# Patient Record
Sex: Female | Born: 1960 | Race: White | Hispanic: No | Marital: Married | Smoking: Former smoker
Health system: Southern US, Community
[De-identification: ages and names within clinical notes are randomized; demographics above are authoritative.]

## PROBLEM LIST (undated history)

## (undated) DIAGNOSIS — K5792 Diverticulitis of intestine, part unspecified, without perforation or abscess without bleeding: Secondary | ICD-10-CM

## (undated) DIAGNOSIS — F419 Anxiety disorder, unspecified: Secondary | ICD-10-CM

## (undated) DIAGNOSIS — E7800 Pure hypercholesterolemia, unspecified: Secondary | ICD-10-CM

## (undated) HISTORY — PX: COLON SURGERY: SHX602

---

## 1991-06-05 HISTORY — PX: ABDOMINAL HYSTERECTOMY: SHX81

## 2012-02-13 HISTORY — PX: PILONIDAL CYST DRAINAGE: SHX743

## 2014-04-14 ENCOUNTER — Ambulatory Visit: Payer: Self-pay | Admitting: Internal Medicine

## 2014-10-11 ENCOUNTER — Encounter: Payer: Self-pay | Admitting: *Deleted

## 2014-10-13 NOTE — Discharge Instructions (Signed)

## 2014-10-15 ENCOUNTER — Ambulatory Visit
Admission: RE | Admit: 2014-10-15 | Discharge: 2014-10-15 | Disposition: A | Discharge status: Home / Self Care | Payer: PRIVATE HEALTH INSURANCE | Source: Ambulatory Visit | Attending: Gastroenterology | Admitting: Gastroenterology

## 2014-10-15 ENCOUNTER — Ambulatory Visit: Payer: PRIVATE HEALTH INSURANCE | Admitting: Student in an Organized Health Care Education/Training Program

## 2014-10-15 ENCOUNTER — Encounter
Admission: RE | Disposition: A | Discharge status: Home / Self Care | Payer: Self-pay | Source: Ambulatory Visit | Attending: Gastroenterology

## 2014-10-15 ENCOUNTER — Other Ambulatory Visit: Payer: Self-pay | Admitting: Gastroenterology

## 2014-10-15 DIAGNOSIS — D124 Benign neoplasm of descending colon: Secondary | ICD-10-CM | POA: Insufficient documentation

## 2014-10-15 DIAGNOSIS — Z79899 Other long term (current) drug therapy: Secondary | ICD-10-CM | POA: Insufficient documentation

## 2014-10-15 DIAGNOSIS — D122 Benign neoplasm of ascending colon: Secondary | ICD-10-CM | POA: Diagnosis not present

## 2014-10-15 DIAGNOSIS — K640 First degree hemorrhoids: Secondary | ICD-10-CM | POA: Insufficient documentation

## 2014-10-15 DIAGNOSIS — K5792 Diverticulitis of intestine, part unspecified, without perforation or abscess without bleeding: Secondary | ICD-10-CM | POA: Diagnosis present

## 2014-10-15 DIAGNOSIS — K5520 Angiodysplasia of colon without hemorrhage: Secondary | ICD-10-CM | POA: Insufficient documentation

## 2014-10-15 DIAGNOSIS — Z9889 Other specified postprocedural states: Secondary | ICD-10-CM | POA: Insufficient documentation

## 2014-10-15 DIAGNOSIS — Z9049 Acquired absence of other specified parts of digestive tract: Secondary | ICD-10-CM | POA: Diagnosis not present

## 2014-10-15 DIAGNOSIS — K5730 Diverticulosis of large intestine without perforation or abscess without bleeding: Secondary | ICD-10-CM | POA: Diagnosis not present

## 2014-10-15 DIAGNOSIS — Z90710 Acquired absence of both cervix and uterus: Secondary | ICD-10-CM | POA: Insufficient documentation

## 2014-10-15 DIAGNOSIS — F17200 Nicotine dependence, unspecified, uncomplicated: Secondary | ICD-10-CM | POA: Insufficient documentation

## 2014-10-15 HISTORY — PX: COLONOSCOPY: SHX5424

## 2014-10-15 HISTORY — DX: Diverticulitis of intestine, part unspecified, without perforation or abscess without bleeding: K57.92

## 2014-10-15 HISTORY — PX: POLYPECTOMY: SHX149

## 2014-10-15 HISTORY — DX: Pure hypercholesterolemia, unspecified: E78.00

## 2014-10-15 SURGERY — COLONOSCOPY
Anesthesia: Monitor Anesthesia Care | Wound class: Contaminated

## 2014-10-15 MED ORDER — OXYCODONE HCL 5 MG/5ML PO SOLN: 5.0000 mg | Freq: Once | ORAL | Status: DC | PRN

## 2014-10-15 MED ORDER — STERILE WATER FOR IRRIGATION IR SOLN
Status: DC | PRN
  Administered 2014-10-15: 10:00:00

## 2014-10-15 MED ORDER — KETOROLAC TROMETHAMINE 30 MG/ML IJ SOLN: 30.0000 mg | Freq: Once | INTRAMUSCULAR | Status: DC | PRN

## 2014-10-15 MED ORDER — OXYCODONE HCL 5 MG PO TABS
5.0000 mg | ORAL_TABLET | Freq: Once | ORAL | Status: DC | PRN
Start: 2014-10-15 — End: 2014-10-15

## 2014-10-15 MED ORDER — LIDOCAINE HCL (CARDIAC) 20 MG/ML IV SOLN
INTRAVENOUS | Status: DC | PRN
  Administered 2014-10-15: 30 mg via INTRAVENOUS

## 2014-10-15 MED ORDER — PROMETHAZINE HCL 25 MG/ML IJ SOLN: 6.2500 mg | INTRAMUSCULAR | Status: DC | PRN

## 2014-10-15 MED ORDER — LIDOCAINE HCL (CARDIAC) 20 MG/ML IV SOLN: INTRAVENOUS | Status: DC | PRN

## 2014-10-15 MED ORDER — PROPOFOL 10 MG/ML IV BOLUS
INTRAVENOUS | Status: DC | PRN
  Administered 2014-10-15 (×3): 20 mg via INTRAVENOUS
  Administered 2014-10-15: 30 mg via INTRAVENOUS
  Administered 2014-10-15: 20 mg via INTRAVENOUS

## 2014-10-15 MED ORDER — HYDROMORPHONE HCL 1 MG/ML IJ SOLN: 0.2500 mg | INTRAMUSCULAR | Status: DC | PRN

## 2014-10-15 MED ORDER — SODIUM CHLORIDE 0.9 % IV SOLN: INTRAVENOUS | Status: DC

## 2014-10-15 MED ORDER — LACTATED RINGERS IV SOLN
INTRAVENOUS | Status: DC
  Administered 2014-10-15 (×2): via INTRAVENOUS

## 2014-10-15 SURGICAL SUPPLY — 27 items
CANISTER SUCT 1200ML W/VALVE (MISCELLANEOUS) ×3 IMPLANT
FCP ESCP3.2XJMB 240X2.8X (MISCELLANEOUS)
FORCEPS BIOP RAD 4 LRG CAP 4 (CUTTING FORCEPS) ×3 IMPLANT
FORCEPS BIOP RJ4 240 W/NDL (MISCELLANEOUS)
FORCEPS ESCP3.2XJMB 240X2.8X (MISCELLANEOUS) IMPLANT
GOWN CVR UNV OPN BCK APRN NK (MISCELLANEOUS) ×4 IMPLANT
GOWN ISOL THUMB LOOP REG UNIV (MISCELLANEOUS) ×2
HEMOCLIP INSTINCT (CLIP) IMPLANT
INJECTOR VARIJECT VIN23 (MISCELLANEOUS) IMPLANT
KIT CO2 TUBING (TUBING) ×3 IMPLANT
KIT DEFENDO VALVE AND CONN (KITS) IMPLANT
KIT ENDO PROCEDURE OLY (KITS) ×3 IMPLANT
LIGATOR MULTIBAND 6SHOOTER MBL (MISCELLANEOUS) IMPLANT
MARKER SPOT ENDO TATTOO 5ML (MISCELLANEOUS) IMPLANT
PAD GROUND ADULT SPLIT (MISCELLANEOUS) IMPLANT
SNARE SHORT THROW 13M SML OVAL (MISCELLANEOUS) ×3 IMPLANT
SNARE SHORT THROW 30M LRG OVAL (MISCELLANEOUS) IMPLANT
SPOT EX ENDOSCOPIC TATTOO (MISCELLANEOUS)
SUCTION POLY TRAP 4CHAMBER (MISCELLANEOUS) IMPLANT
TRAP SUCTION POLY (MISCELLANEOUS) IMPLANT
TUBING CONN 6MMX3.1M (TUBING)
TUBING SUCTION CONN 0.25 STRL (TUBING) IMPLANT
UNDERPAD 30X60 958B10 (PK) (MISCELLANEOUS) IMPLANT
VALVE BIOPSY ENDO (VALVE) IMPLANT
VARIJECT INJECTOR VIN23 (MISCELLANEOUS)
WATER AUXILLARY (MISCELLANEOUS) IMPLANT
WATER STERILE IRR 500ML POUR (IV SOLUTION) ×3 IMPLANT

## 2014-10-15 NOTE — Op Note (Signed)
[facility] Gastroenterology Patient Name: Jennifer Oconnor Procedure Date: 10/15/2014 9:35 AM MRN: [id number] Account #: [id number] Date of Birth: 11-May-1961 Admit Type: Outpatient Age: 54 Room: [facility] OR ROOM 01 Gender: Female Note Status: Finalized Procedure:         Colonoscopy Indications:       Diverticulitis Providers:         Lucilla Lame, MD Referring MD:      Leona Carry. Hall Busing, MD (Referring MD) Medicines:         Propofol per Anesthesia Complications:     No immediate complications. Procedure:         Pre-Anesthesia Assessment:                    - Prior to the procedure, a History and Physical was                     performed, and patient medications and allergies were                     reviewed. The patient's tolerance of previous anesthesia                     was also reviewed. The risks and benefits of the procedure                     and the sedation options and risks were discussed with the                     patient. All questions were answered, and informed consent                     was obtained. Prior Anticoagulants: The patient has taken                     no previous anticoagulant or antiplatelet agents. ASA                     Grade Assessment: II - A patient with mild systemic                     disease. After reviewing the risks and benefits, the                     patient was deemed in satisfactory condition to undergo                     the procedure.                    After obtaining informed consent, the colonoscope was                     passed under direct vision. Throughout the procedure, the                     patient's blood pressure, pulse, and oxygen saturations                     were monitored continuously. The Olympus CF-HQ190L                     Colonoscope (S#. [id number]) was introduced through the anus  and advanced to the the cecum, identified by appendiceal                     orifice and ileocecal  valve. The colonoscopy was performed                     without difficulty. The patient tolerated the procedure                     well. The quality of the bowel preparation was excellent. Findings:      The perianal and digital rectal examinations were normal.      A 3 mm polyp was found in the ascending colon. The polyp was sessile.       The polyp was removed with a cold biopsy forceps. Resection and       retrieval were complete.      A 6 mm polyp was found in the descending colon. The polyp was sessile.       The polyp was removed with a cold snare. Resection and retrieval were       complete.      Multiple small-mouthed diverticula were found in the sigmoid colon and       in the descending colon.      Non-bleeding internal hemorrhoids were found during retroflexion. The       hemorrhoids were Grade I (internal hemorrhoids that do not prolapse).      A single medium-sized angiodysplastic lesion without bleeding was found       in the ascending colon. Impression:        - One 3 mm polyp in the ascending colon. Resected and                     retrieved.                    - One 6 mm polyp in the descending colon. Resected and                     retrieved.                    - Diverticulosis in the sigmoid colon and in the                     descending colon.                    - Non-bleeding internal hemorrhoids.                    - A single non-bleeding colonic angiodysplastic lesion. Recommendation:    - Await pathology results.                    - Repeat colonoscopy in 5 years if polyp adenoma and 10                     years if hyperplastic Procedure Code(s): --- Professional ---                    [phone], Colonoscopy, flexible; with removal of tumor(s),                     polyp(s), or other lesion(s) by snare technique  [id number], 68, Colonoscopy, flexible; with biopsy, single or                     multiple Diagnosis Code(s): --- Professional ---                     K57.32, Diverticulitis of large intestine without                     perforation or abscess without bleeding                    D12.2, Benign neoplasm of ascending colon                    D12.4, Benign neoplasm of descending colon                    K57.30, Diverticulosis of large intestine without                     perforation or abscess without bleeding CPT copyright 2014 American Medical Association. All rights reserved. The codes documented in this report are preliminary and upon coder review may  be revised to meet current compliance requirements. Lucilla Lame, MD 10/15/2014 10:18:38 AM This report has been signed electronically. Number of Addenda: 0 Note Initiated On: 10/15/2014 9:35 AM Scope Withdrawal Time: 0 hours 10 minutes 18 seconds  Total Procedure Duration: 0 hours 11 minutes 57 seconds       [facility]

## 2014-10-15 NOTE — H&P (Signed)
  [facility]  [address], [place], [place] [address] Phone: [phone] Fax : [phone]  Primary Care Physician:  Albina Billet, MD Primary Gastroenterologist:  Dr. Allen Norris  Pre-Procedure History & Physical: HPI:  Jennifer Oconnor is a 54 y.o. female is here for an colonoscopy.   Past Medical History  Diagnosis Date  . Diverticulitis     in past  . Hypercholesteremia     Past Surgical History  Procedure Laterality Date  . Colon surgery      colectomy 9/11  . Abdominal hysterectomy  1993  . Pilonidal cyst drainage  02/13/12    Prior to Admission medications   Medication Sig Start Date End Date Taking? Authorizing Provider  ALPRAZolam [name]) 0.5 MG tablet Take 0.5 mg by mouth 2 (two) times daily as needed for anxiety.   Yes Historical Provider, MD  buPROPion (WELLBUTRIN XL) 300 MG 24 hr tablet Take 300 mg by mouth daily. AM   Yes Historical Provider, MD  estradiol (VIVELLE-DOT) 0.025 MG/24HR Place 1 patch onto the skin 2 (two) times a week. Wed/Sun   Yes Historical Provider, MD    Allergies as of 10/07/2014  . (Not on File)    History reviewed. No pertinent family history.  History   Social History  . Marital Status: Married    Spouse Name: N/A  . Number of Children: N/A  . Years of Education: N/A   Occupational History  . Not on file.   Social History Main Topics  . Smoking status: Current Every Day Smoker -- 0.50 packs/day for 40 years  . Smokeless tobacco: Not on file  . Alcohol Use: No  . Drug Use: Not on file  . Sexual Activity: Not on file   Other Topics Concern  . Not on file   Social History Narrative  . No narrative on file    Review of Systems: See HPI, otherwise negative ROS  Physical Exam: BP 126/71 mmHg  Pulse 83  Temp(Src) 97.9 F (36.6 C) (Temporal)  Resp 16  Ht 5' (1.524 m)  Wt 148 lb (67.132 kg)  BMI 28.90 kg/m2  SpO2 100% General:   Alert,  pleasant and cooperative in NAD Head:  Normocephalic and  atraumatic. Neck:  Supple; no masses or thyromegaly. Lungs:  Clear throughout to auscultation.    Heart:  Regular rate and rhythm. Abdomen:  Soft, nontender and nondistended. Normal bowel sounds, without guarding, and without rebound.   Neurologic:  Alert and  oriented x4;  grossly normal neurologically.  Impression/Plan: Jennifer Oconnor is here for an colonoscopy to be performed for diverticulitis  Risks, benefits, limitations, and alternatives regarding  colonoscopy have been reviewed with the patient.  Questions have been answered.  All parties agreeable.   [facility], MD  10/15/2014, 9:50 AM

## 2014-10-15 NOTE — Anesthesia Postprocedure Evaluation (Signed)
  Anesthesia Post-op Note  Patient: Jennifer Oconnor  Procedure(s) Performed: Procedure(s) with comments: COLONOSCOPY (N/A) - cecum time- 1003 POLYPECTOMY INTESTINAL  Anesthesia type:MAC  Patient location: PACU  Post pain: Pain level controlled  Post assessment: Post-op Vital signs reviewed, Patient's Cardiovascular Status Stable, Respiratory Function Stable, Patent Airway and No signs of Nausea or vomiting  Post vital signs: Reviewed and stable  Last Vitals:  Filed Vitals:   10/15/14 1045  BP: 125/69  Pulse: 80  Temp:   Resp: 15    Level of consciousness: awake, alert  and patient cooperative  Complications: No apparent anesthesia complications

## 2014-10-15 NOTE — Transfer of Care (Signed)
Immediate Anesthesia Transfer of Care Note  Patient: Jennifer Oconnor  Procedure(s) Performed: Procedure(s) with comments: COLONOSCOPY (N/A) - cecum time- 1003 POLYPECTOMY INTESTINAL  Patient Location: PACU  Anesthesia Type: MAC  Level of Consciousness: awake, alert  and patient cooperative  Airway and Oxygen Therapy: Patient Spontanous Breathing and Patient connected to supplemental oxygen  Post-op Assessment: Post-op Vital signs reviewed, Patient's Cardiovascular Status Stable, Respiratory Function Stable, Patent Airway and No signs of Nausea or vomiting  Post-op Vital Signs: Reviewed and stable  Complications: No apparent anesthesia complications

## 2014-10-15 NOTE — Anesthesia Preprocedure Evaluation (Signed)
Anesthesia Evaluation  Patient identified by MRN, date of birth, ID band Patient awake    Reviewed: Allergy & Precautions, NPO status , Patient's Chart, lab work & pertinent test results  Airway Mallampati: II  TM Distance: >3 FB Neck ROM: Full    Dental no notable dental hx.    Pulmonary neg pulmonary ROS, Current Smoker,  breath sounds clear to auscultation  Pulmonary exam normal       Cardiovascular negative cardio ROS Normal cardiovascular examRhythm:Regular Rate:Normal     Neuro/Psych negative neurological ROS  negative psych ROS   GI/Hepatic negative GI ROS, Neg liver ROS, diverticulitis   Endo/Other  negative endocrine ROS  Renal/GU negative Renal ROS  negative genitourinary   Musculoskeletal negative musculoskeletal ROS (+)   Abdominal   Peds negative pediatric ROS (+)  Hematology negative hematology ROS (+)   Anesthesia Other Findings   Reproductive/Obstetrics negative OB ROS                             Anesthesia Physical Anesthesia Plan  ASA: II  Anesthesia Plan: MAC   Post-op Pain Management:    Induction: Intravenous  Airway Management Planned:   Additional Equipment:   Intra-op Plan:   Post-operative Plan: Extubation in OR  Informed Consent: I have reviewed the patients History and Physical, chart, labs and discussed the procedure including the risks, benefits and alternatives for the proposed anesthesia with the patient or authorized representative who has indicated his/her understanding and acceptance.   Dental advisory given  Plan Discussed with: CRNA  Anesthesia Plan Comments:         Anesthesia Quick Evaluation

## 2014-10-18 ENCOUNTER — Encounter: Payer: Self-pay | Admitting: Gastroenterology

## 2015-03-22 ENCOUNTER — Ambulatory Visit
Admission: RE | Admit: 2015-03-22 | Discharge: 2015-03-22 | Disposition: A | Discharge status: Home / Self Care | Payer: PRIVATE HEALTH INSURANCE | Source: Ambulatory Visit | Attending: Internal Medicine | Admitting: Internal Medicine

## 2015-03-22 ENCOUNTER — Other Ambulatory Visit: Payer: Self-pay | Admitting: Internal Medicine

## 2015-03-22 DIAGNOSIS — R05 Cough: Secondary | ICD-10-CM | POA: Insufficient documentation

## 2015-03-22 DIAGNOSIS — R0600 Dyspnea, unspecified: Secondary | ICD-10-CM | POA: Diagnosis not present

## 2015-03-22 DIAGNOSIS — R059 Cough, unspecified: Secondary | ICD-10-CM

## 2015-03-22 DIAGNOSIS — R509 Fever, unspecified: Secondary | ICD-10-CM | POA: Insufficient documentation

## 2015-10-18 DIAGNOSIS — M7550 Bursitis of unspecified shoulder: Secondary | ICD-10-CM | POA: Insufficient documentation

## 2015-11-20 DIAGNOSIS — F17210 Nicotine dependence, cigarettes, uncomplicated: Secondary | ICD-10-CM | POA: Insufficient documentation

## 2016-10-24 DIAGNOSIS — M5412 Radiculopathy, cervical region: Secondary | ICD-10-CM | POA: Insufficient documentation

## 2016-11-07 ENCOUNTER — Ambulatory Visit (INDEPENDENT_AMBULATORY_CARE_PROVIDER_SITE_OTHER): Payer: BLUE CROSS/BLUE SHIELD | Admitting: Advanced Practice Midwife

## 2016-11-07 ENCOUNTER — Encounter: Payer: Self-pay | Admitting: Advanced Practice Midwife

## 2016-11-07 VITALS — BP 124/78 | Ht 60.0 in | Wt 162.0 lb

## 2016-11-07 DIAGNOSIS — R1030 Lower abdominal pain, unspecified: Secondary | ICD-10-CM | POA: Diagnosis not present

## 2016-11-07 DIAGNOSIS — F419 Anxiety disorder, unspecified: Secondary | ICD-10-CM | POA: Insufficient documentation

## 2016-11-07 DIAGNOSIS — R1032 Left lower quadrant pain: Principal | ICD-10-CM

## 2016-11-07 DIAGNOSIS — R1031 Right lower quadrant pain: Secondary | ICD-10-CM

## 2016-11-07 DIAGNOSIS — F329 Major depressive disorder, single episode, unspecified: Secondary | ICD-10-CM | POA: Insufficient documentation

## 2016-11-07 DIAGNOSIS — F32A Depression, unspecified: Secondary | ICD-10-CM | POA: Insufficient documentation

## 2016-11-07 NOTE — Progress Notes (Signed)
S: Patient is here today with a complaint of bilateral groin pain that is worse on the left side that she first noticed on Monday of this week. She had this pain one other time about a month ago and it went away after a few days. The pain radiates into her thighs and it hurts to stand or walk. She denies injury, trauma. There are no aggravating or alleviating factors that she knows of. She has taken advil one time since Monday with minimal relief. She has a history of "cysts" in her groin and thighs that she has removed by another provider. She currently has a "cyst" in the left groin that she is taking antibiotics for.   O: Vital Signs: BP 124/78   Ht 5' (1.524 m)   Wt 162 lb (73.5 kg)   BMI 31.64 kg/m  Constitutional: Well nourished, well developed female in no acute distress.  HEENT: normal Skin: Warm and dry.  Cardiovascular: Regular rate and rhythm.   Extremity: no evidence of DVT  Respiratory: Clear to auscultation bilateral. Normal respiratory effort Abdomen: soft, nontender, nondistended, no abnormal masses, no epigastric pain and scar from hysterectomy incision has a hard area on left side which could be scar tissue. Mildly tender to palpation Back: no CVAT Neuro: DTRs 2+, Cranial nerves grossly intact Psych: Alert and Oriented x3. No memory deficits. Normal mood and affect.  MS: normal gait, normal bilateral lower extremity ROM/strength/stability.  Pelvic exam:  is not limited by body habitus EGBUS: within normal limits Vagina: within normal limits and with normal mucosa  Cervix: not visualized, no CMT No tenderness to palpation Left groin with hard area mildly tender to palpation- resolving cyst/boil  A: 56 year old with groin pain Possible muscle, tendon or ligament strain Possible scar tissue at abdominal incision site Possible pain from infected cyst/boil in left groin  P: Gyn ultrasound for possible scar tissue on incision and f/u with MD    Rod Can, CNM

## 2016-11-19 ENCOUNTER — Ambulatory Visit (INDEPENDENT_AMBULATORY_CARE_PROVIDER_SITE_OTHER): Payer: BLUE CROSS/BLUE SHIELD

## 2016-11-19 ENCOUNTER — Ambulatory Visit (INDEPENDENT_AMBULATORY_CARE_PROVIDER_SITE_OTHER): Payer: BLUE CROSS/BLUE SHIELD | Admitting: Obstetrics and Gynecology

## 2016-11-19 ENCOUNTER — Encounter: Payer: Self-pay | Admitting: Obstetrics and Gynecology

## 2016-11-19 VITALS — BP 110/72 | HR 80 | Ht 60.0 in | Wt 162.0 lb

## 2016-11-19 DIAGNOSIS — L732 Hidradenitis suppurativa: Secondary | ICD-10-CM

## 2016-11-19 DIAGNOSIS — R1031 Right lower quadrant pain: Secondary | ICD-10-CM

## 2016-11-19 DIAGNOSIS — R1030 Lower abdominal pain, unspecified: Secondary | ICD-10-CM

## 2016-11-19 DIAGNOSIS — R1032 Left lower quadrant pain: Principal | ICD-10-CM

## 2016-11-19 NOTE — Patient Instructions (Signed)
What is hidradenitis suppurativa? - Hidradenitis suppurativa is a condition that causes red, swollen, painful bumps to form on the body, usually in places where the skin rubs together. These bumps can cause so much pain that they make it hard to move. They can smell bad or drain pus or blood. The bumps also tend to linger for weeks or months and keep coming back. People who have hidradenitis suppurativa, also called "HS," often have a hard time dealing with their problem. It can make them feel embarrassed and worried. Sometimes the condition can even cause problems in relationships, or in the workplace. If you have this problem, see a doctor or nurse. There are treatments that can help you. What are the symptoms of HS? - The main symptoms are red, swollen, painful bumps. These bumps can drain pus or blood. The bumps usually form in places where the skin rubs together. Common locations include: ?Armpits ?On or under the breasts (in women) ?In the groin area ?Inner thighs ?Buttocks ?Around or near the anus The skin problems caused by HS last a long time and get worse over time. Often the skin hardens and scars around the painful bumps. Plus, many bumps can form in a single area and sometimes form tunnels under the skin. Should I see a doctor or nurse? - Yes. If you have symptoms of HS, see a doctor or nurse. He or she can look at your skin and find out if HS is the cause of your symptoms. Make sure you tell the doctor or nurse if you feel sad or embarrassed because of your symptoms. The doctor or nurse can help you deal with these problems. It's also important to see your doctor regularly if you have HS. People with HS have a higher chance of getting other health problems, too, such as diabetes and heart disease. Your doctor can check for these problems and suggest treatment if needed. How is HS treated? - Treatment can include: ?Antibiotic liquids or gels that you put on the affected areas ?Antibiotic  pills, which you might need to take for a long time ?Injections of steroid medicines into affected areas to bring down inflammation Women with HS sometimes also take hormone treatments to improve their condition. This usually involves taking a birth control pill every day. Sometimes women take other types of hormone medicines, too. People with severe, long-lasting problems can have surgery that helps HS to heal. They can also get special medicines that can reduce inflammation in the skin. Is there anything I can do on my own to feel better? - Yes. First, you should know that you did not do anything to cause your condition. It is not your fault. You did not cause it by being unclean. You should also know that you cannot spread your condition to anyone else. It is not "contagious." Here are some things you can do to reduce your symptoms: ?Do not wear tight-fitting clothes ?Try to avoid activities that cause your skin to rub against itself a lot ?Shower every day and wash areas of HS gently with your fingers. Do not scrub affected areas with a washcloth, loofah, or brush. ?Stop smoking, if you smoke. People who smoke are more likely to have HS ?Lose weight, if you are overweight. HS is more common and more severe in people who are overweight.

## 2016-11-19 NOTE — Progress Notes (Signed)
Gynecology Ultrasound Follow Up  Chief Complaint:  Chief Complaint  Patient presents with  . Follow-up    GYN Ultrasound follow up     History of Present Illness: Patient is a 56 y.o. female who presents today for ultrasound evaluation of LLQ pain.  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  Has had one since onset of symptoms, did drain, and symptoms have since improved.  Review of Systems: Review of Systems  Constitutional: Negative for chills, fever and weight loss.  Gastrointestinal: Positive for abdominal pain. Negative for constipation and diarrhea.    Past Medical History:  Past Medical History:  Diagnosis Date  . Diverticulitis    in past  . Hypercholesteremia     Past Surgical History:  Past Surgical History:  Procedure Laterality Date  . ABDOMINAL HYSTERECTOMY  1993  . COLON SURGERY     colectomy 9/11  . COLONOSCOPY N/A 10/15/2014   Procedure: COLONOSCOPY;  Surgeon: Lucilla Lame, MD;  Location: [facility];  Service: Gastroenterology;  Laterality: N/A;  cecum time- 1003  . PILONIDAL CYST DRAINAGE  02/13/12  . POLYPECTOMY  10/15/2014   Procedure: POLYPECTOMY INTESTINAL;  Surgeon: Lucilla Lame, MD;  Location: [facility];  Service: Gastroenterology;;    Gynecologic History:  No LMP recorded. Patient has had a hysterectomy.  Family History:  History reviewed. No pertinent family history.  Social History:  Social History   Social History  . Marital status: Married    Spouse name: N/A  . Number of children: N/A  . Years of education: N/A   Occupational History  . Not on file.   Social History Main Topics  . Smoking status: Current Every Day Smoker    Packs/day: 0.50    Years: 40.00  . Smokeless tobacco: Never Used  .  Alcohol use No  . Drug use: No  . Sexual activity: Yes    Birth control/ protection: Surgical   Other Topics Concern  . Not on file   Social History Narrative  . No narrative on file    Allergies:  No Known Allergies  Medications: Prior to Admission medications   Medication Sig Start Date End Date Taking? Authorizing Provider  ALPRAZolam [name]) 0.5 MG tablet Take 0.5 mg by mouth 2 (two) times daily as needed for anxiety.   Yes [provider]  buPROPion (WELLBUTRIN XL) 300 MG 24 hr tablet Take 300 mg by mouth daily. AM   Yes [provider]  cephALEXin (KEFLEX) 500 MG capsule cephalexin 500 mg capsule  TAKE ONE CAPSULE BY MOUTH 3 TIMES A DAY FOR 10 DAYS   Yes [provider]  doxycycline (VIBRAMYCIN) 100 MG capsule doxycycline hyclate 100 mg capsule  TK 1 C PO BID WITH FOOD AND DRINK   Yes [provider]  estradiol (VIVELLE-DOT) 0.025 MG/24HR Place 1 patch onto the skin 2 (two) times a week. Wed/Sun   Yes [provider]  metoprolol succinate (TOPROL-XL) 25 MG 24 hr tablet Take 25 mg by mouth daily.   Yes [provider]    Physical Exam Vitals: Blood pressure 110/72, pulse 80, height 5' (1.524 m), weight 162 lb (73.5 kg).  General: NAD HEENT: normocephalic, anicteric Pulmonary: No increased work of breathing Extremities: no edema, erythema, or tenderness External female genital: Grossly normal.  There are changes consistent  with prior I&D's groin excisions.   Small sinus tract appreciate in the right groin.  No active drainage. Neurologic: Grossly intact, normal gait Psychiatric: mood appropriate, affect full   Assessment: 56 y.o. G2P1011 No problem-specific Assessment & Plan notes found for this encounter.   Plan: Problem List Items Addressed This Visit    None    Visit Diagnoses    Hydradenitis    -  Primary      1) Changes consistent with hydradenitis - is established with dermatology 2) Normal gyn  imaging - I do not see any evidence of gyn pathology to attribute to the patient's symptoms and given active signs of hydradenitis matching laterality of symptoms this is the most likely explanation.   3) A total of 15 minutes were spent in face-to-face contact with the patient during this encounter with over half of that time devoted to counseling and coordination of care.           See [facility] dermatology [place] normal

## 2017-05-28 ENCOUNTER — Emergency Department: Payer: PRIVATE HEALTH INSURANCE

## 2017-05-28 ENCOUNTER — Encounter: Payer: Self-pay | Admitting: Emergency Medicine

## 2017-05-28 ENCOUNTER — Emergency Department
Admission: EM | Admit: 2017-05-28 | Discharge: 2017-05-28 | Disposition: A | Discharge status: Home / Self Care | Payer: PRIVATE HEALTH INSURANCE | Attending: Emergency Medicine | Admitting: Emergency Medicine

## 2017-05-28 ENCOUNTER — Other Ambulatory Visit: Payer: Self-pay

## 2017-05-28 DIAGNOSIS — Y999 Unspecified external cause status: Secondary | ICD-10-CM | POA: Insufficient documentation

## 2017-05-28 DIAGNOSIS — S060X0A Concussion without loss of consciousness, initial encounter: Secondary | ICD-10-CM

## 2017-05-28 DIAGNOSIS — Z23 Encounter for immunization: Secondary | ICD-10-CM | POA: Insufficient documentation

## 2017-05-28 DIAGNOSIS — W000XXA Fall on same level due to ice and snow, initial encounter: Secondary | ICD-10-CM | POA: Insufficient documentation

## 2017-05-28 DIAGNOSIS — F17200 Nicotine dependence, unspecified, uncomplicated: Secondary | ICD-10-CM | POA: Insufficient documentation

## 2017-05-28 DIAGNOSIS — Y9301 Activity, walking, marching and hiking: Secondary | ICD-10-CM | POA: Insufficient documentation

## 2017-05-28 DIAGNOSIS — S01112A Laceration without foreign body of left eyelid and periocular area, initial encounter: Secondary | ICD-10-CM

## 2017-05-28 DIAGNOSIS — Z79899 Other long term (current) drug therapy: Secondary | ICD-10-CM | POA: Insufficient documentation

## 2017-05-28 DIAGNOSIS — Y929 Unspecified place or not applicable: Secondary | ICD-10-CM | POA: Insufficient documentation

## 2017-05-28 DIAGNOSIS — S61412A Laceration without foreign body of left hand, initial encounter: Secondary | ICD-10-CM | POA: Insufficient documentation

## 2017-05-28 MED ORDER — LIDOCAINE-EPINEPHRINE (PF) 2 %-1:200000 IJ SOLN
10.0000 mL | Freq: Once | INTRAMUSCULAR | Status: AC
  Administered 2017-05-28: 10 mL via INTRADERMAL
  Filled 2017-05-28: qty 10

## 2017-05-28 MED ORDER — TETANUS-DIPHTH-ACELL PERTUSSIS 5-2.5-18.5 LF-MCG/0.5 IM SUSP
0.5000 mL | Freq: Once | INTRAMUSCULAR | Status: AC
  Administered 2017-05-28: 0.5 mL via INTRAMUSCULAR
  Filled 2017-05-28: qty 0.5

## 2017-05-28 NOTE — ED Notes (Signed)
Pt ambulating without assistance

## 2017-05-28 NOTE — ED Triage Notes (Signed)
Pt slipped and fell hitting left forehead and left hand. Per husband pt was confused s/p injury. Pt a/o now. Denies LOC. Denies blood thinners. Also concerned that she has wood in hand.

## 2017-05-28 NOTE — ED Notes (Signed)
ED Provider at bedside. 

## 2017-05-28 NOTE — ED Provider Notes (Signed)
[facility] Emergency Department Provider Note  ____________________________________________   First MD Initiated Contact with Patient 05/28/17 [phone]     (approximate)  I have reviewed the triage vital signs and the nursing notes.   HISTORY  Chief Complaint Fall    HPI Jennifer Oconnor is a 56 y.o. female with a history of diverticulitis was presented to the emergency department today after a fall.  She says that she was walking up a plywood ramp that had ice on it when she slipped and fell forward.  She denies any loss of consciousness but her husband says that she was quite confused after the fall.  Says that it hurts over the left side of her eyebrow on the left where she sustained a laceration but otherwise does not of any nausea, dizziness.  Says that she "basically feels like myself."  Denies taking any blood thinners.  She also has a laceration over the webspace between the index finger and the thumb on the left where she pulled out a wood chip.  She is concerned that there still may be a wood in there but does not have any foreign body sensation.  No weakness, numbness or pain to the left hand.  Patient's last tetanus shot was in 2010.  Also sustained an abrasion over the left anterior knee but is able to ambulate without issue.  Past Medical History:  Diagnosis Date  . Diverticulitis    in past  . Hypercholesteremia     Patient Active Problem List   Diagnosis Date Noted  . Anxiety 11/07/2016  . Depression 11/07/2016  . Cervical radiculopathy 10/24/2016  . Cigarette smoker 11/20/2015  . Bursitis of shoulder 10/18/2015    Past Surgical History:  Procedure Laterality Date  . ABDOMINAL HYSTERECTOMY  1993  . COLON SURGERY     colectomy 9/11  . COLONOSCOPY N/A 10/15/2014   Procedure: COLONOSCOPY;  Surgeon: Lucilla Lame, MD;  Location: [place];  Service: Gastroenterology;  Laterality: N/A;  cecum time- 1003  . PILONIDAL CYST DRAINAGE   02/13/12  . POLYPECTOMY  10/15/2014   Procedure: POLYPECTOMY INTESTINAL;  Surgeon: Lucilla Lame, MD;  Location: [place];  Service: Gastroenterology;;    Prior to Admission medications   Medication Sig Start Date End Date Taking? Authorizing Provider  ALPRAZolam [name]) 0.5 MG tablet Take 0.5 mg by mouth 2 (two) times daily as needed for anxiety.    [provider]  buPROPion (WELLBUTRIN XL) 300 MG 24 hr tablet Take 300 mg by mouth daily. AM    [provider]  cephALEXin (KEFLEX) 500 MG capsule cephalexin 500 mg capsule  TAKE ONE CAPSULE BY MOUTH 3 TIMES A DAY FOR 10 DAYS    [provider]  doxycycline (VIBRAMYCIN) 100 MG capsule doxycycline hyclate 100 mg capsule  TK 1 C PO BID WITH FOOD AND DRINK    [provider]  estradiol (VIVELLE-DOT) 0.025 MG/24HR Place 1 patch onto the skin 2 (two) times a week. Wed/Sun    [provider]  metoprolol succinate (TOPROL-XL) 25 MG 24 hr tablet Take 25 mg by mouth daily.    [provider]    Allergies Patient has no known allergies.  History reviewed. No pertinent family history.  Social History Social History   Tobacco Use  . Smoking status: Current Every Day Smoker    Packs/day: 0.50    Years: 40.00    Pack years: 20.00  . Smokeless tobacco: Never Used  Substance  Use Topics  . Alcohol use: No  . Drug use: No    Review of Systems  Constitutional: No fever/chills Eyes: No visual changes. ENT: No sore throat. Cardiovascular: Denies chest pain. Respiratory: Denies shortness of breath. Gastrointestinal: No abdominal pain.  No nausea, no vomiting.  No diarrhea.  No constipation. Genitourinary: Negative for dysuria. Musculoskeletal: Negative for back pain. Skin: Negative for rash. Neurological: Negative for focal weakness or numbness.   ____________________________________________   PHYSICAL EXAM:  VITAL SIGNS: ED Triage Vitals  Enc Vitals Group     BP 05/28/17  0817 (!) 178/82     Pulse Rate 05/28/17 0817 100     Resp 05/28/17 0817 18     Temp 05/28/17 0817 98.4 F (36.9 C)     Temp Source 05/28/17 0817 Oral     SpO2 05/28/17 0817 97 %     Weight 05/28/17 0817 130 lb (59 kg)     Height 05/28/17 0817 5\' 5"  (1.651 m)     Head Circumference --      Peak Flow --      Pain Score 05/28/17 0818 7     Pain Loc --      Pain Edu? --      Excl. in [place]? --     Constitutional: Alert and oriented. Well appearing and in no acute distress. Eyes: Conjunctivae are normal.  Head: 3 cm laceration overlying the left lateral left eyebrow.  The laceration is mostly linear but as that goes more laterally it splits into 2 limb for about 1/2 cm segment each.  No depression over the laceration.  No active bleeding.  No pus. Nose: No congestion/rhinnorhea. Mouth/Throat: Mucous membranes are moist.  Neck: No stridor.  No tenderness to palpation to the midline cervical spine. Cardiovascular: Normal rate, regular rhythm. Grossly normal heart sounds.  Good peripheral circulation. Respiratory: Normal respiratory effort.  No retractions. Lungs CTAB. Gastrointestinal: Soft and nontender. No distention. No CVA tenderness. Musculoskeletal: No lower extremity tenderness nor edema.  No joint effusions. Neurologic:  Normal speech and language. No gross focal neurologic deficits are appreciated. Skin: Left hand with a 3 cm, V-shaped laceration to the webspace between the left index anger and thumb no foreign body palpated nor visualized.  Patient without any bony tenderness to the hand and full range of motion of the fingers and the wrist.  Brisk capillary refill.  Sensation is intact light touch.  No active bleeding.  1 cm, circular, superficial abrasion over the left anterior knee without any tenderness to the knee.  Full active range of motion.  Psychiatric: Mood and affect are normal. Speech and behavior are normal.  ____________________________________________   LABS (all  labs ordered are listed, but only abnormal results are displayed)  Labs Reviewed - No data to display ____________________________________________  EKG   ____________________________________________  RADIOLOGY  No acute findings on imaging.  No fb visualized.  ____________________________________________   PROCEDURES  Procedure(s) performed:    Marland KitchenMarland KitchenLaceration Repair Date/Time: 05/28/2017 10:17 AM Performed by: Orbie Pyo, MD Authorized by: Orbie Pyo, MD   Consent:    Consent obtained:  Verbal   Consent given by:  Patient   Risks discussed:  Infection, pain, retained foreign body, poor cosmetic result and poor wound healing   Alternatives discussed:  No treatment Anesthesia (see MAR for exact dosages):    Anesthesia method:  Local infiltration   Local anesthetic:  Lidocaine 2% WITH epi Laceration details:    Location:  Face  Face location:  L eyebrow   Length (cm):  4   Depth (mm):  4 Repair type:    Repair type:  Simple Pre-procedure details:    Preparation:  Patient was prepped and draped in usual sterile fashion and imaging obtained to evaluate for foreign bodies Exploration:    Hemostasis achieved with:  Direct pressure   Wound exploration: entire depth of wound probed and visualized     Contaminated: no   Treatment:    Area cleansed with:  Saline   Amount of cleaning:  Extensive   Irrigation solution:  Sterile saline   Irrigation method:  Pressure wash   Visualized foreign bodies/material removed: no   Skin repair:    Repair method:  Sutures   Suture size:  6-0   Suture material:  Nylon   Suture technique:  Simple interrupted   Number of sutures:  2 Approximation:    Approximation:  Close Post-procedure details:    Patient tolerance of procedure:  Tolerated well, no immediate complications Comments:     Used 2 sutures laterally and then dermabond medially.  Good cosmesis achieved .Marland KitchenLaceration Repair Date/Time:  05/28/2017 10:26 AM Performed by: Orbie Pyo, MD Authorized by: Orbie Pyo, MD   Consent:    Consent obtained:  Verbal   Consent given by:  Patient   Risks discussed:  Infection, pain, retained foreign body, poor cosmetic result and poor wound healing   Alternatives discussed:  No treatment Anesthesia (see MAR for exact dosages):    Anesthesia method:  Local infiltration   Local anesthetic:  Lidocaine 2% WITH epi Laceration details:    Location:  Hand   Hand location:  L palm   Length (cm):  3   Depth (mm):  3 Repair type:    Repair type:  Simple Pre-procedure details:    Preparation:  Patient was prepped and draped in usual sterile fashion Exploration:    Hemostasis achieved with:  Direct pressure   Wound exploration: entire depth of wound probed and visualized     Contaminated: no   Treatment:    Area cleansed with:  Saline   Amount of cleaning:  Extensive   Irrigation solution:  Sterile saline   Irrigation method:  Pressure wash   Visualized foreign bodies/material removed: no   Skin repair:    Repair method:  Sutures   Suture size:  3-0   Suture material:  Nylon   Number of sutures:  3 Approximation:    Approximation:  Close   V[place] border: well-aligned   Post-procedure details:    Patient tolerance of procedure:  Tolerated well, no immediate complications    Critical Care performed:   ____________________________________________   INITIAL IMPRESSION / ASSESSMENT AND PLAN / ED COURSE  Pertinent labs & imaging results that were available during my care of the patient were reviewed by me and considered in my medical decision making (see chart for details).  DDX: Concussion, facial laceration, skull fracture, intra-cranial hemorrhage, hand laceration, foreign body to the hand.    ----------------------------------------- 10:27 AM on 05/28/2017 -----------------------------------------  Imaging reassuring.  Wounds closed and  patient instructed to keep the wounds dry for 24 hours and then may shower normally but not bathe or submerge the wounds.  Patient knows to have the facial sutures taken out in 5 days and the hand sutures taken out in 10.  Patient still with headache but right over the site of the eyebrow laceration.  However, due to patient being confused earlier  I will give her concussion discharge instructions.  She is aware to practice brain rest.  She is understanding of the diagnosis as well as the treatment plan willing to comply.  ____________________________________________   FINAL CLINICAL IMPRESSION(S) / ED DIAGNOSES  Fall.  Concussion.  Eyebrow laceration.  Hand laceration.    NEW MEDICATIONS STARTED DURING THIS VISIT:  This SmartLink is deprecated. Use AVSMEDLIST instead to display the medication list for a patient.   Note:  This document was prepared using Dragon voice recognition software and may include unintentional dictation errors.     Orbie Pyo, MD 05/28/17 1028

## 2017-05-28 NOTE — ED Notes (Signed)
Wounds cleaned and dressed with dry sterile dressing. Pt ambulated out of department

## 2017-06-04 DIAGNOSIS — I69328 Other speech and language deficits following cerebral infarction: Secondary | ICD-10-CM

## 2017-06-04 DIAGNOSIS — I639 Cerebral infarction, unspecified: Secondary | ICD-10-CM

## 2017-06-04 HISTORY — DX: Cerebral infarction, unspecified: I63.9

## 2017-06-04 HISTORY — DX: Other speech and language deficits following cerebral infarction: I69.328

## 2017-06-11 ENCOUNTER — Inpatient Hospital Stay
Admission: EM | Admit: 2017-06-11 | Discharge: 2017-06-16 | DRG: 038 | Disposition: A | Discharge status: Home / Self Care | Payer: PRIVATE HEALTH INSURANCE | Attending: Internal Medicine | Admitting: Internal Medicine

## 2017-06-11 ENCOUNTER — Emergency Department: Payer: Self-pay

## 2017-06-11 ENCOUNTER — Inpatient Hospital Stay: Payer: Self-pay

## 2017-06-11 ENCOUNTER — Encounter: Payer: Self-pay | Admitting: Emergency Medicine

## 2017-06-11 DIAGNOSIS — E7800 Pure hypercholesterolemia, unspecified: Secondary | ICD-10-CM | POA: Diagnosis present

## 2017-06-11 DIAGNOSIS — I639 Cerebral infarction, unspecified: Principal | ICD-10-CM | POA: Diagnosis present

## 2017-06-11 DIAGNOSIS — Z9114 Patient's other noncompliance with medication regimen: Secondary | ICD-10-CM

## 2017-06-11 DIAGNOSIS — I10 Essential (primary) hypertension: Secondary | ICD-10-CM | POA: Diagnosis present

## 2017-06-11 DIAGNOSIS — I739 Peripheral vascular disease, unspecified: Secondary | ICD-10-CM | POA: Diagnosis present

## 2017-06-11 DIAGNOSIS — Z90710 Acquired absence of both cervix and uterus: Secondary | ICD-10-CM

## 2017-06-11 DIAGNOSIS — F419 Anxiety disorder, unspecified: Secondary | ICD-10-CM | POA: Diagnosis present

## 2017-06-11 DIAGNOSIS — F17200 Nicotine dependence, unspecified, uncomplicated: Secondary | ICD-10-CM | POA: Diagnosis present

## 2017-06-11 DIAGNOSIS — Z823 Family history of stroke: Secondary | ICD-10-CM

## 2017-06-11 DIAGNOSIS — G8191 Hemiplegia, unspecified affecting right dominant side: Secondary | ICD-10-CM | POA: Diagnosis present

## 2017-06-11 DIAGNOSIS — E785 Hyperlipidemia, unspecified: Secondary | ICD-10-CM | POA: Diagnosis present

## 2017-06-11 DIAGNOSIS — R29701 NIHSS score 1: Secondary | ICD-10-CM | POA: Diagnosis present

## 2017-06-11 HISTORY — DX: Anxiety disorder, unspecified: F41.9

## 2017-06-11 LAB — LIPID PANEL
CHOL/HDL RATIO: 7.1 ratio
CHOLESTEROL: 247 mg/dL — AB (ref 0–200)
HDL: 35 mg/dL — AB (ref >40)
LDL Cholesterol: UNDETERMINED mg/dL (ref 0–99)
Triglycerides: 512 mg/dL — ABNORMAL HIGH (ref <150)
VLDL: UNDETERMINED mg/dL (ref 0–40)

## 2017-06-11 LAB — COMPREHENSIVE METABOLIC PANEL
ALT: 18 U/L (ref 14–54)
AST: 31 U/L (ref 15–41)
Albumin: 4.5 g/dL (ref 3.5–5.0)
Alkaline Phosphatase: 90 U/L (ref 38–126)
Anion gap: 9 (ref 5–15)
BILIRUBIN TOTAL: 1.1 mg/dL (ref 0.3–1.2)
BUN: 16 mg/dL (ref 6–20)
CHLORIDE: 105 mmol/L (ref 101–111)
CO2: 23 mmol/L (ref 22–32)
CREATININE: 1.24 mg/dL — AB (ref 0.44–1.00)
Calcium: 9.7 mg/dL (ref 8.9–10.3)
GFR, EST AFRICAN AMERICAN: 55 mL/min — AB (ref >60)
GFR, EST NON AFRICAN AMERICAN: 47 mL/min — AB (ref >60)
Glucose, Bld: 119 mg/dL — ABNORMAL HIGH (ref 65–99)
POTASSIUM: 4.8 mmol/L (ref 3.5–5.1)
Sodium: 137 mmol/L (ref 135–145)
Total Protein: 7.8 g/dL (ref 6.5–8.1)

## 2017-06-11 LAB — CBC WITH DIFFERENTIAL/PLATELET
BASOS ABS: 0.1 10*3/uL (ref 0–0.1)
Basophils Relative: 1 %
EOS ABS: 0.1 10*3/uL (ref 0–0.7)
EOS PCT: 1 %
HCT: 43.3 % (ref 35.0–47.0)
Hemoglobin: 14.7 g/dL (ref 12.0–16.0)
LYMPHS ABS: 3.3 10*3/uL (ref 1.0–3.6)
LYMPHS PCT: 35 %
MCH: 32.1 pg (ref 26.0–34.0)
MCHC: 33.9 g/dL (ref 32.0–36.0)
MCV: 94.8 fL (ref 80.0–100.0)
Monocytes Absolute: 0.6 10*3/uL (ref 0.2–0.9)
Monocytes Relative: 6 %
NEUTROS PCT: 57 %
Neutro Abs: 5.4 10*3/uL (ref 1.4–6.5)
PLATELETS: 300 10*3/uL (ref 150–440)
RBC: 4.57 MIL/uL (ref 3.80–5.20)
RDW: 13.7 % (ref 11.5–14.5)
WBC: 9.4 10*3/uL (ref 3.6–11.0)

## 2017-06-11 MED ORDER — ASPIRIN 81 MG PO CHEW
324.0000 mg | CHEWABLE_TABLET | Freq: Once | ORAL | Status: AC
  Administered 2017-06-11: 324 mg via ORAL
  Filled 2017-06-11: qty 4

## 2017-06-11 MED ORDER — ACETAMINOPHEN 650 MG RE SUPP
650.0000 mg | RECTAL | Status: DC | PRN
Start: 2017-06-11 — End: 2017-06-14

## 2017-06-11 MED ORDER — ACETAMINOPHEN 325 MG PO TABS: 650.0000 mg | ORAL_TABLET | ORAL | Status: DC | PRN

## 2017-06-11 MED ORDER — ENOXAPARIN SODIUM 40 MG/0.4ML ~~LOC~~ SOLN
40.0000 mg | SUBCUTANEOUS | Status: DC
  Administered 2017-06-11 – 2017-06-15 (×5): 40 mg via SUBCUTANEOUS
  Filled 2017-06-11 (×5): qty 0.4

## 2017-06-11 MED ORDER — ALPRAZOLAM 0.5 MG PO TABS
0.5000 mg | ORAL_TABLET | Freq: Two times a day (BID) | ORAL | Status: DC | PRN
  Administered 2017-06-11 – 2017-06-15 (×4): 0.5 mg via ORAL
  Filled 2017-06-11 (×4): qty 1

## 2017-06-11 MED ORDER — ASPIRIN 325 MG PO TABS
325.0000 mg | ORAL_TABLET | Freq: Every day | ORAL | Status: DC
  Administered 2017-06-12: 11:00:00 325 mg via ORAL
  Filled 2017-06-11: qty 1

## 2017-06-11 MED ORDER — HYDRALAZINE HCL 20 MG/ML IJ SOLN: 10.0000 mg | Freq: Four times a day (QID) | INTRAMUSCULAR | Status: DC | PRN

## 2017-06-11 MED ORDER — SODIUM CHLORIDE 0.9 % IV SOLN
Freq: Once | INTRAVENOUS | Status: AC
  Administered 2017-06-11: 21:00:00 via INTRAVENOUS

## 2017-06-11 MED ORDER — STROKE: EARLY STAGES OF RECOVERY BOOK
Freq: Once | Status: AC
  Administered 2017-06-11: 21:00:00

## 2017-06-11 MED ORDER — BUPROPION HCL ER (XL) 150 MG PO TB24
300.0000 mg | ORAL_TABLET | Freq: Every day | ORAL | Status: DC
Start: 2017-06-12 — End: 2017-06-16
  Administered 2017-06-12 – 2017-06-16 (×4): 300 mg via ORAL
  Filled 2017-06-11 (×3): qty 1
  Filled 2017-06-11: qty 2
  Filled 2017-06-11: qty 1

## 2017-06-11 MED ORDER — ACETAMINOPHEN 160 MG/5ML PO SOLN
650.0000 mg | ORAL | Status: DC | PRN
  Filled 2017-06-11: qty 20.3

## 2017-06-11 MED ORDER — ASPIRIN 300 MG RE SUPP
300.0000 mg | Freq: Every day | RECTAL | Status: DC
  Filled 2017-06-11: qty 1

## 2017-06-11 NOTE — H&P (Addendum)
[facility] [place] [facility] [place] NAME: Jennifer Oconnor    MR#:  [id number]  DATE OF BIRTH:  09/04/1960  DATE OF ADMISSION:  06/11/2017  PRIMARY CARE PHYSICIAN: Albina Billet, MD   REQUESTING/REFERRING PHYSICIAN: Dr. Rip Harbour  CHIEF COMPLAINT:   My thought process was not proper HISTORY OF PRESENT ILLNESS:  Jennifer Oconnor  is a 57 y.o. female with a known history of anxiety and hyperlipidemia comes to the emergency room after she started noticing her thought process was not proper.  She was taking a long time to process of part since morning.  She also had some numbness in her right upper extremity.  Husband came home from work and noticed similar findings and brought to the emergency room where CT of the head shows possible evolving left frontal stroke in the MCA territory Patient denies any dysphagia numbness tingling focal weakness. She received aspirin.  She is being admitted for further evaluation and management  PAST MEDICAL HISTORY:   Past Medical History:  Diagnosis Date  . Diverticulitis    in past  . Hypercholesteremia     PAST SURGICAL HISTOIRY:   Past Surgical History:  Procedure Laterality Date  . ABDOMINAL HYSTERECTOMY  1993  . COLON SURGERY     colectomy 9/11  . COLONOSCOPY N/A 10/15/2014   Procedure: COLONOSCOPY;  Surgeon: Lucilla Lame, MD;  Location: [place];  Service: Gastroenterology;  Laterality: N/A;  cecum time- 1003  . PILONIDAL CYST DRAINAGE  02/13/12  . POLYPECTOMY  10/15/2014   Procedure: POLYPECTOMY INTESTINAL;  Surgeon: Lucilla Lame, MD;  Location: [place];  Service: Gastroenterology;;    SOCIAL HISTORY:   Social History   Tobacco Use  . Smoking status: Current Every Day Smoker    Packs/day: 0.50    Years: 40.00    Pack years: 20.00  . Smokeless tobacco: Never Used  Substance Use Topics  . Alcohol use: No    FAMILY HISTORY:  No family history on file.  DRUG ALLERGIES:  No Known  Allergies  REVIEW OF SYSTEMS:  Review of Systems  Constitutional: Negative for chills, fever and weight loss.  HENT: Negative for ear discharge, ear pain and nosebleeds.   Eyes: Negative for blurred vision, pain and discharge.  Respiratory: Negative for sputum production, shortness of breath, wheezing and stridor.   Cardiovascular: Negative for chest pain, palpitations, orthopnea and PND.  Gastrointestinal: Negative for abdominal pain, diarrhea, nausea and vomiting.  Genitourinary: Negative for frequency and urgency.  Musculoskeletal: Negative for back pain and joint pain.  Neurological: Positive for tingling, speech change and weakness. Negative for sensory change and focal weakness.  Psychiatric/Behavioral: Negative for depression and hallucinations. The patient is not nervous/anxious.      MEDICATIONS AT HOME:   Prior to Admission medications   Medication Sig Start Date End Date Taking? Authorizing Provider  ALPRAZolam [name]) 0.5 MG tablet Take 0.5 mg by mouth 2 (two) times daily as needed for anxiety.   Yes [provider]  buPROPion (WELLBUTRIN XL) 300 MG 24 hr tablet Take 300 mg by mouth daily. AM   Yes [provider]  estradiol (VIVELLE-DOT) 0.025 MG/24HR Place 1 patch onto the skin 2 (two) times a week. Wed/Sun   Yes [provider]      VITAL SIGNS:  Blood pressure (!) 177/86, pulse 92, temperature 98.1 F (36.7 C), resp. rate 16, height 5\' 5"  (1.651 m), weight 59 kg (130 lb), SpO2 97 %.  PHYSICAL EXAMINATION:  GENERAL:  57 y.o.-year-old patient lying in the bed with no acute distress.  EYES: Pupils equal, round, reactive to light and accommodation. No scleral icterus. Extraocular muscles intact.  HEENT: Head atraumatic, normocephalic. Oropharynx and nasopharynx clear.  NECK:  Supple, no jugular venous distention. No thyroid enlargement, no tenderness.  LUNGS: Normal breath sounds bilaterally, no wheezing, rales,rhonchi or crepitation. No use  of accessory muscles of respiration.  CARDIOVASCULAR: S1, S2 normal. No murmurs, rubs, or gallops.  ABDOMEN: Soft, nontender, nondistended. Bowel sounds present. No organomegaly or mass.  EXTREMITIES: No pedal edema, cyanosis, or clubbing.  NEUROLOGIC: Cranial nerves II through XII are intact. Muscle strength 5/5 in all extremities. Sensation intact. Gait not checked.  Speech clear although she is slow to respond.  No focal weakness.  No dysarthria PSYCHIATRIC: The patient is alert and oriented x 3.  SKIN: No obvious rash, lesion, or ulcer.   LABORATORY PANEL:   CBC Recent Labs  Lab 06/11/17 1812  WBC 9.4  HGB 14.7  HCT 43.3  PLT 300   ------------------------------------------------------------------------------------------------------------------  Chemistries  Recent Labs  Lab 06/11/17 1812  NA 137  K 4.8  CL 105  CO2 23  GLUCOSE 119*  BUN 16  CREATININE 1.24*  CALCIUM 9.7  AST 31  ALT 18  ALKPHOS 90  BILITOT 1.1   ------------------------------------------------------------------------------------------------------------------  Cardiac Enzymes No results for input(s): TROPONINI in the last 168 hours. ------------------------------------------------------------------------------------------------------------------  RADIOLOGY:  Ct Head Wo Contrast  Result Date: 06/11/2017 CLINICAL DATA:  Acute presentation with right-sided weakness beginning this morning. EXAM: CT HEAD WITHOUT CONTRAST TECHNIQUE: Contiguous axial images were obtained from the base of the skull through the vertex without intravenous contrast. COMPARISON:  05/28/2017 FINDINGS: Brain: Question region of cortical and subcortical low-density developing in the left frontal lobe axial images 15 through 18. No sign of swelling or hemorrhage. The remainder the brain appears normal. No hydrocephalus. No extra-axial collection. Vascular: No abnormal vascular finding. Skull: Normal Sinuses/Orbits: Clear/normal  Other: None IMPRESSION: Question area of developing low-density in the left frontal lobe which could represent changes of acute infarction in a left MCA branch vessel. No swelling or hemorrhage. I do not think the findings are absolutely conclusive but they are suspicious. Electronically Signed   By: Nelson Chimes M.D.   On: 06/11/2017 18:51   Dg Chest Portable 1 View  Result Date: 06/11/2017 CLINICAL DATA:  Right side weakness since waking up this morning. EXAM: PORTABLE CHEST 1 VIEW COMPARISON:  PA and lateral chest 03/22/2015 and 04/14/2014. FINDINGS: Lungs are clear. Heart size is normal. No pneumothorax or pleural fluid. No bony abnormality. IMPRESSION: Negative chest. Electronically Signed   By: Inge Rise M.D.   On: 06/11/2017 18:56    EKG:   Normal sinus rhythm IMPRESSION AND PLAN:   Jennifer Oconnor  is a 57 y.o. female with a known history of anxiety and hyperlipidemia comes to the emergency room after she started noticing her thought process was not proper.  She was taking a long time to process of part since morning.  She also had some numbness in her right upper extremity.  Husband came home from work and noticed similar findings and brought to the emergency room where CT of the head shows possible evolving left frontal stroke in the MCA territory  1.  Acute CVA left frontal lobe -Admit to 1 c -Aspirin 325 daily -Ultrasound carotid Doppler, echo, MRI brain -Neurology consultation -Neurochecks per protocol -Lipid profile  2.  Hyperlipidemia -Start  patient on statins  3 chronic anxiety continue Xanax and buspirone  4.  Tobacco abuse Smoking cessation advised 4 minutes spent  5.  Elevated blood pressure without diagnosis of hypertension -We will allow permissive hypertension -PRN hydralazine for systolic greater than 552  Was discussed with patient patient's brother and husband   All the records are reviewed and case discussed with ED provider. Management plans  discussed with the patient, family and they are in agreement.  CODE STATUS: full  TOTAL TIME TAKING CARE OF THIS PATIENT: *50* minutes.    Fritzi Mandes M.D on 06/11/2017 at 7:57 PM  Between 7am to 6pm - Pager - [phone]  After 6pm go to [email or web address] - password [email or web address] [facility]  [facility]  Office  [phone]  CC: Primary care physician; Albina Billet, MD

## 2017-06-11 NOTE — ED Triage Notes (Signed)
Pt arrived via ems from home with complaints of right sided weakness that started when she woke up this morning. Pt went to bed last night around 2300. Upon arrival to ED pt alert and oriented to person and place but unable to tell me her age in years, birthdate, or the current year.

## 2017-06-11 NOTE — ED Provider Notes (Addendum)
[facility] Emergency Department Provider Note   ____________________________________________   First MD Initiated Contact with Patient 06/11/17 1810     (approximate)  I have reviewed the triage vital signs and the nursing notes.   HISTORY  Chief Complaint Weakness  Chief complaint of strokelike symptoms  HPI Manasvini Whatley is a 57 y.o. female who comes in reporting she's had memory difficulty couldn't remember the year or date little bit of blurry vision on the right side which is since cleared and right-sided weakness. There is still a small amount of right-sided weakness present. Symptoms came on early this morning she says. She has a history of hypertension she supposed be taking metoprolol but present. She doesn't take it because it makes her feel bad.   Past Medical History:  Diagnosis Date  . Anxiety   . Diverticulitis    in past  . Hypercholesteremia     Patient Active Problem List   Diagnosis Date Noted  . CVA (cerebral vascular accident) ([place]) 06/11/2017  . Anxiety 11/07/2016  . Depression 11/07/2016  . Cervical radiculopathy 10/24/2016  . Cigarette smoker 11/20/2015  . Bursitis of shoulder 10/18/2015    Past Surgical History:  Procedure Laterality Date  . ABDOMINAL HYSTERECTOMY  1993  . COLON SURGERY     colectomy 9/11  . COLONOSCOPY N/A 10/15/2014   Procedure: COLONOSCOPY;  Surgeon: Lucilla Lame, MD;  Location: [place];  Service: Gastroenterology;  Laterality: N/A;  cecum time- 1003  . PILONIDAL CYST DRAINAGE  02/13/12  . POLYPECTOMY  10/15/2014   Procedure: POLYPECTOMY INTESTINAL;  Surgeon: Lucilla Lame, MD;  Location: [place];  Service: Gastroenterology;;    Prior to Admission medications   Medication Sig Start Date End Date Taking? Authorizing Provider  ALPRAZolam [name]) 0.5 MG tablet Take 0.5 mg by mouth 2 (two) times daily as needed for anxiety.   Yes [provider]  buPROPion  (WELLBUTRIN XL) 300 MG 24 hr tablet Take 300 mg by mouth daily. AM   Yes [provider]  estradiol (VIVELLE-DOT) 0.025 MG/24HR Place 1 patch onto the skin 2 (two) times a week. Wed/Sun   Yes [provider]    Allergies Patient has no known allergies.  History reviewed. No pertinent family history.  Social History Social History   Tobacco Use  . Smoking status: Current Every Day Smoker    Packs/day: 0.50    Years: 40.00    Pack years: 20.00  . Smokeless tobacco: Never Used  Substance Use Topics  . Alcohol use: No  . Drug use: No    Review of Systems  Constitutional: No fever/chills Eyes:see history of present illness ENT: No sore throat. Cardiovascular: Denies chest pain. Respiratory: Denies shortness of breath. Gastrointestinal: No abdominal pain.  No nausea, no vomiting.  No diarrhea.  No constipation. Genitourinary: Negative for dysuria. Musculoskeletal: Negative for back pain. Skin: Negative for rash. Neurological: see history of present illness ____________________________________________   PHYSICAL EXAM:  VITAL SIGNS: ED Triage Vitals  Enc Vitals Group     BP      Pulse      Resp      Temp      Temp src      SpO2      Weight      Height      Head Circumference      Peak Flow      Pain Score      Pain Loc  Pain Edu?      Excl. in March ARB?     Constitutional: Alert and oriented. Well appearing and in no acute distress. Eyes: Conjunctivae are normal. PERRL. EOMI. Head: Atraumatic. Nose: No congestion/rhinnorhea. Mouth/Throat: Mucous membranes are moist.  Oropharynx non-erythematous. Neck: No stridor.  Cardiovascular: Normal rate, regular rhythm. Grossly normal heart sounds.  Good peripheral circulation. Respiratory: Normal respiratory effort.  No retractions. Lungs CTAB. Gastrointestinal: Soft and nontender. No distention. No abdominal bruits. No CVA tenderness.:  Musculoskeletal: No lower extremity tenderness nor edema.  No  joint effusions. Neurologic:  Normal speech and language. there is a hint of right sided weakness remaining. Otherwise I cannot find any focal neurologic deficits. This includes the cranial nerves cerebellar finger to nose heel-to-shin and rapid alternating movements and hands and sensation.. Skin:  Skin is warm, dry and intact. No rash noted. Psychiatric: Mood and affect are normal. Speech and behavior are normal.  ____________________________________________   LABS (all labs ordered are listed, but only abnormal results are displayed)  Labs Reviewed  COMPREHENSIVE METABOLIC PANEL - Abnormal; Notable for the following components:      Result Value   Glucose, Bld 119 (*)    Creatinine, Ser 1.24 (*)    GFR calc non Af Amer 47 (*)    GFR calc Af Amer 55 (*)    All other components within normal limits  LIPID PANEL - Abnormal; Notable for the following components:   Cholesterol 247 (*)    Triglycerides 512 (*)    HDL 35 (*)    All other components within normal limits  CBC WITH DIFFERENTIAL/PLATELET  HEMOGLOBIN A1C  LIPID PANEL   ____________________________________________  EKG   ____________________________________________  RADIOLOGY chest x-ray no acute disease per my interpretation radiology now agrees.  CT scan shows a possible developing left frontal lobe infarct    ____________________________________________   PROCEDURES  Procedure(s) performed:   Procedures  Critical Care performed:   ____________________________________________   INITIAL IMPRESSION / ASSESSMENT AND PLAN / ED COURSE        ____________________________________________   FINAL CLINICAL IMPRESSION(S) / ED DIAGNOSES  patient had focal neurological deficits with some residual. Clinically this presents like a stroke. We will treat her as such.  Final diagnoses:  Cerebrovascular accident (CVA), unspecified mechanism [facility])     ED Discharge Orders    None       Note:  This  document was prepared using Dragon voice recognition software and may include unintentional dictation errors.    Nena Polio, MD 06/11/17 2345    Nena Polio, MD 06/25/17 2238

## 2017-06-11 NOTE — ED Notes (Signed)
Patient transported to CT 

## 2017-06-12 ENCOUNTER — Inpatient Hospital Stay: Payer: Self-pay

## 2017-06-12 ENCOUNTER — Other Ambulatory Visit: Payer: Self-pay

## 2017-06-12 ENCOUNTER — Inpatient Hospital Stay (HOSPITAL_COMMUNITY)
Admit: 2017-06-12 | Discharge: 2017-06-12 | Disposition: A | Discharge status: Home / Self Care | Payer: Self-pay | Attending: Internal Medicine | Admitting: Internal Medicine

## 2017-06-12 DIAGNOSIS — I10 Essential (primary) hypertension: Secondary | ICD-10-CM

## 2017-06-12 DIAGNOSIS — I639 Cerebral infarction, unspecified: Secondary | ICD-10-CM | POA: Diagnosis not present

## 2017-06-12 LAB — LIPID PANEL
Cholesterol: 247 mg/dL — ABNORMAL HIGH (ref 0–200)
HDL: 32 mg/dL — AB (ref >40)
LDL CALC: UNDETERMINED mg/dL (ref 0–99)
Total CHOL/HDL Ratio: 7.7 RATIO
Triglycerides: 755 mg/dL — ABNORMAL HIGH (ref <150)
VLDL: UNDETERMINED mg/dL (ref 0–40)

## 2017-06-12 LAB — HEMOGLOBIN A1C
HEMOGLOBIN A1C: 5.4 % (ref 4.8–5.6)
Mean Plasma Glucose: 108.28 mg/dL

## 2017-06-12 LAB — ANTITHROMBIN III: ANTITHROMB III FUNC: 97 % (ref 75–120)

## 2017-06-12 LAB — ECHOCARDIOGRAM COMPLETE
Height: 60 in
WEIGHTICAEL: 2524.8 [oz_av]

## 2017-06-12 LAB — SEDIMENTATION RATE: Sed Rate: 37 mm/hr — ABNORMAL HIGH (ref 0–30)

## 2017-06-12 MED ORDER — ASPIRIN EC 81 MG PO TBEC
81.0000 mg | DELAYED_RELEASE_TABLET | Freq: Every day | ORAL | Status: DC
  Administered 2017-06-13 – 2017-06-16 (×3): 81 mg via ORAL
  Filled 2017-06-12 (×3): qty 1

## 2017-06-12 MED ORDER — CLOPIDOGREL BISULFATE 75 MG PO TABS
75.0000 mg | ORAL_TABLET | Freq: Every day | ORAL | Status: DC
  Administered 2017-06-12 – 2017-06-13 (×2): 75 mg via ORAL
  Filled 2017-06-12 (×2): qty 1

## 2017-06-12 MED ORDER — IOPAMIDOL (ISOVUE-370) INJECTION 76%
75.0000 mL | Freq: Once | INTRAVENOUS | Status: AC | PRN
  Administered 2017-06-12: 75 mL via INTRAVENOUS

## 2017-06-12 NOTE — Evaluation (Signed)
Occupational Therapy Evaluation Patient Details Name: Jennifer Oconnor MRN: [id number] DOB: 1960-09-03 Today's Date: 06/12/2017    History of Present Illness 57 y.o. female with a history of HTN noncompliant with medications who reports awakening 1/8 and feeling as if she was not thinking clearly and started to notice some tingling in her right upper extremity. Initial NIH stroke scale of 1.   Clinical Impression   Pt seen for OT evaluation this date. Prior to admission, pt independent with all aspects of mobility and ADL/IADL. Pt eager to return to PLOF. Lives with spouse in a 1 story home with 3 steps to enter and bilateral hand rails. Pt has good family support. Pt recently retired. Pt presents with mild strength deficits R>L, intact sensation, no apparent visual deficits, and deficits in cognition. Montreal Cognitive Assessment [facility]) performed to assess cognitive functioning. Pt scored 14/30 (>/= to 26/30 is normal) indicating cognitive impairment. Pt had most difficulty with visuospatial/executive functioning, clock drawing test, immediate and delayed recall, attention, and abstraction. Pt/spouse educated in cognitive compensatory strategies to support safety with medication mgt, exercises and activities she can perform to support her cognition, and home/routine modifications to maximize safety and functional independence while minimizing risk of falls and injury. Encouraged pt to speak with physician about return to driving. Pt will benefit from skilled OT services to address noted impairments and functional deficits with IADL tasks requiring higher level cognitive functioning (e.g., medication mgt, financial mgt, driving, etc.) Recommend acute inpatient rehab following hospitalization to maximize return to functional independence.     Follow Up Recommendations  CIR    Equipment Recommendations  None recommended by OT    Recommendations for Other Services Rehab consult     Precautions /  Restrictions Precautions Precautions: None Restrictions Weight Bearing Restrictions: No      Mobility Bed Mobility Overal bed mobility: Modified Independent             General bed mobility comments: cautious, slow but no assist required  Transfers Overall transfer level: Modified independent               General transfer comment: cautious, slow but no assist required    Balance Overall balance assessment: Needs assistance Sitting-balance support: No upper extremity supported;Feet supported Sitting balance-Leahy Scale: Normal       Standing balance-Leahy Scale: Fair                             ADL either performed or assessed with clinical judgement   ADL Overall ADL's : Modified independent                                       General ADL Comments: able to perform ADL tasks with modified independence, taking slightly additional time to perform     Vision Baseline Vision/History: Wears glasses Wears Glasses: At all times Patient Visual Report: No change from baseline Vision Assessment?: No apparent visual deficits     Perception     Praxis      Pertinent Vitals/Pain Pain Assessment: No/denies pain     Hand Dominance Right   Extremity/Trunk Assessment Upper Extremity Assessment Upper Extremity Assessment: RUE deficits/detail(LUE 4+/5) RUE Deficits / Details: 4/5, sensation intact, slightly increased processing of fine motor tasks but utimately able to do   Lower Extremity Assessment Lower Extremity Assessment: Defer to PT evaluation;Overall  WFL for tasks assessed   Cervical / Trunk Assessment Cervical / Trunk Assessment: Normal   Communication Communication Communication: No difficulties   Cognition Arousal/Alertness: Awake/alert Behavior During Therapy: WFL for tasks assessed/performed Overall Cognitive Status: Impaired/Different from baseline Area of Impairment: Attention;Memory;Problem solving;Following  commands                   Current Attention Level: Focused Memory: Decreased short-term memory Following Commands: Follows one step commands consistently;Follows multi-step commands with increased time     Problem Solving: Slow processing;Requires verbal cues General Comments: delayed immediate and delayed recall   General Comments       Exercises Other Exercises Other Exercises: [place] Cognitive Assessment [facility]) performed to assess cognitive functioning. Pt scored 14/30 (>/= to 26/30 is normal) indicating cognitive impairment. Pt had most difficulty with visuospatial/executive functioning, clock drawing test, immediate and delayed recall, attention, and abstraction.    Shoulder Instructions      Home Living Family/patient expects to be discharged to:: Private residence Living Arrangements: Spouse/significant other Available Help at Discharge: Family;Available 24 hours/day;Available PRN/intermittently Type of Home: House Home Access: Stairs to enter [employer] of Steps: 3 Entrance Stairs-Rails: Can reach both;Left;Right Home Layout: One level     Bathroom Shower/Tub: Teacher, early years/pre: Standard     Home Equipment: None          Prior Functioning/Environment Level of Independence: Independent        Comments: pt independent with mobility and ADL, recently retired, 1 fall in October (slipped on frost)        OT Problem List: Decreased strength;Decreased cognition;Impaired balance (sitting and/or standing)      OT Treatment/Interventions: Self-care/ADL training;Cognitive remediation/compensation;Therapeutic activities;Therapeutic exercise;Patient/family education;Visual/perceptual remediation/compensation    OT Goals(Current goals can be found in the care plan section) Acute Rehab OT Goals Patient Stated Goal: get better OT Goal Formulation: With patient/family Time For Goal Achievement: 06/26/17 Potential to Achieve  Goals: Good  OT Frequency: Min 3X/week   Barriers to D/C:            Co-evaluation              AM-PAC PT "6 Clicks" Daily Activity     Outcome Measure Help from another person eating meals?: None Help from another person taking care of personal grooming?: None Help from another person toileting, which includes using toliet, bedpan, or urinal?: None Help from another person bathing (including washing, rinsing, drying)?: A Little Help from another person to put on and taking off regular upper body clothing?: None Help from another person to put on and taking off regular lower body clothing?: None 6 Click Score: 23   End of Session    Activity Tolerance: Patient tolerated treatment well Patient left: in bed;with call bell/phone within reach;with family/visitor present  OT Visit Diagnosis: Other symptoms and signs involving cognitive function;Hemiplegia and hemiparesis;Other abnormalities of gait and mobility (R26.89) Hemiplegia - Right/Left: Right Hemiplegia - dominant/non-dominant: Dominant Hemiplegia - caused by: Cerebral infarction                Time: 3474-2595 OT Time Calculation (min): 47 min Charges:  OT General Charges $OT Visit: 1 Visit OT Evaluation $OT Eval Low Complexity: 1 Low OT Treatments $Self Care/Home Management : 8-22 mins $Cognitive Skills Development: 8-22 mins  [name], MPH, MS, OTR/L ascom [phone] 06/12/17, 4:28 PM

## 2017-06-12 NOTE — Plan of Care (Signed)
  Progressing Education: Knowledge of disease or condition will improve 06/12/2017 0353 - Progressing by Denice Bors, RN Knowledge of secondary prevention will improve 06/12/2017 0353 - Progressing by Denice Bors, RN Knowledge of patient specific risk factors addressed and post discharge goals established will improve 06/12/2017 0353 - Progressing by Denice Bors, RN Health Behavior/Discharge Planning: Ability to manage health-related needs will improve 06/12/2017 0353 - Progressing by Denice Bors, RN Self-Care: Ability to participate in self-care as condition permits will improve 06/12/2017 0353 - Progressing by Denice Bors, RN Ability to communicate needs accurately will improve 06/12/2017 0353 - Progressing by Denice Bors, RN Ischemic Stroke/TIA Tissue Perfusion: Complications of ischemic stroke/TIA will be minimized 06/12/2017 0353 - Progressing by Denice Bors, RN Education: Knowledge of the prescribed therapeutic regimen will improve 06/12/2017 0353 - Progressing by Denice Bors, RN Coping: Ability to identify and develop effective coping behavior will improve 06/12/2017 0353 - Progressing by Denice Bors, RN Education: Knowledge of General Education information will improve 06/12/2017 0353 - Progressing by Denice Bors, RN Health Behavior/Discharge Planning: Ability to manage health-related needs will improve 06/12/2017 0353 - Progressing by Denice Bors, RN Clinical Measurements: Ability to maintain clinical measurements within normal limits will improve 06/12/2017 0353 - Progressing by Denice Bors, RN Will remain free from infection 06/12/2017 0353 - Progressing by Denice Bors, RN Diagnostic test results will improve 06/12/2017 0353 - Progressing by Denice Bors, RN Respiratory complications will improve 06/12/2017 0353 - Progressing by Denice Bors, RN Cardiovascular complication will be avoided 06/12/2017 0353 - Progressing by  Denice Bors, RN Activity: Risk for activity intolerance will decrease 06/12/2017 0353 - Progressing by Denice Bors, RN Nutrition: Adequate nutrition will be maintained 06/12/2017 0353 - Progressing by Denice Bors, RN Coping: Level of anxiety will decrease 06/12/2017 0353 - Progressing by Denice Bors, RN Elimination: Will not experience complications related to bowel motility 06/12/2017 0353 - Progressing by Denice Bors, RN Will not experience complications related to urinary retention 06/12/2017 0353 - Progressing by Denice Bors, RN Pain Managment: General experience of comfort will improve 06/12/2017 0353 - Progressing by Denice Bors, RN Safety: Ability to remain free from injury will improve 06/12/2017 0353 - Progressing by Denice Bors, RN Skin Integrity: Risk for impaired skin integrity will decrease 06/12/2017 0353 - Progressing by Denice Bors, RN

## 2017-06-12 NOTE — Progress Notes (Signed)
[facility] [place] NAME: Temeca Somma    MR#:  [id number]  DATE OF BIRTH:  [birth year]  SUBJECTIVE:  CHIEF COMPLAINT:   Chief Complaint  Patient presents with  . Weakness  Patient emotional, overall doing better REVIEW OF SYSTEMS:  Review of Systems  Constitutional: Negative for chills, fever and weight loss.  HENT: Negative for nosebleeds and sore throat.   Eyes: Negative for blurred vision.  Respiratory: Negative for cough, shortness of breath and wheezing.   Cardiovascular: Negative for chest pain, orthopnea, leg swelling and PND.  Gastrointestinal: Negative for abdominal pain, constipation, diarrhea, heartburn, nausea and vomiting.  Genitourinary: Negative for dysuria and urgency.  Musculoskeletal: Negative for back pain.  Skin: Negative for rash.  Neurological: Positive for speech change. Negative for dizziness, focal weakness and headaches.  Endo/Heme/Allergies: Does not bruise/bleed easily.  Psychiatric/Behavioral: Negative for depression.    DRUG ALLERGIES:  No Known Allergies VITALS:  Blood pressure (!) 131/59, pulse 86, temperature 98.6 F (37 C), temperature source Oral, resp. rate 20, height 5' (1.524 m), weight 71.6 kg (157 lb 12.8 oz), SpO2 98 %. PHYSICAL EXAMINATION:  Physical Exam  Constitutional: She is oriented to person, place, and time and well-developed, well-nourished, and in no distress.  HENT:  Head: Normocephalic and atraumatic.  Eyes: Conjunctivae and EOM are normal. Pupils are equal, round, and reactive to light.  Neck: Normal range of motion. Neck supple. No tracheal deviation present. No thyromegaly present.  Cardiovascular: Normal rate, regular rhythm and normal heart sounds.  Pulmonary/Chest: Effort normal and breath sounds normal. No respiratory distress. She has no wheezes. She exhibits no tenderness.  Abdominal: Soft. Bowel sounds are normal. She exhibits no distension. There is no  tenderness.  Musculoskeletal: Normal range of motion.  Neurological: She is alert and oriented to person, place, and time. No cranial nerve deficit.  Skin: Skin is warm and dry. No rash noted.  Psychiatric: Mood and affect normal.   LABORATORY PANEL:  Female CBC Recent Labs  Lab 06/11/17 1812  WBC 9.4  HGB 14.7  HCT 43.3  PLT 300   ------------------------------------------------------------------------------------------------------------------ Chemistries  Recent Labs  Lab 06/11/17 1812  NA 137  K 4.8  CL 105  CO2 23  GLUCOSE 119*  BUN 16  CREATININE 1.24*  CALCIUM 9.7  AST 31  ALT 18  ALKPHOS 90  BILITOT 1.1   RADIOLOGY:  Ct Head Wo Contrast  Result Date: 06/11/2017 CLINICAL DATA:  Acute presentation with right-sided weakness beginning this morning. EXAM: CT HEAD WITHOUT CONTRAST TECHNIQUE: Contiguous axial images were obtained from the base of the skull through the vertex without intravenous contrast. COMPARISON:  05/28/2017 FINDINGS: Brain: Question region of cortical and subcortical low-density developing in the left frontal lobe axial images 15 through 18. No sign of swelling or hemorrhage. The remainder the brain appears normal. No hydrocephalus. No extra-axial collection. Vascular: No abnormal vascular finding. Skull: Normal Sinuses/Orbits: Clear/normal Other: None IMPRESSION: Question area of developing low-density in the left frontal lobe which could represent changes of acute infarction in a left MCA branch vessel. No swelling or hemorrhage. I do not think the findings are absolutely conclusive but they are suspicious. Electronically Signed   By: Nelson Chimes M.D.   On: 06/11/2017 18:51   Ct Angio Neck W Or Wo Contrast  Result Date: 06/12/2017 CLINICAL DATA:  Stroke EXAM: CT ANGIOGRAPHY NECK TECHNIQUE: Multidetector CT imaging of the neck was  performed using the standard protocol during bolus administration of intravenous contrast. Multiplanar CT image reconstructions  and MIPs were obtained to evaluate the vascular anatomy. Carotid stenosis measurements (when applicable) are obtained utilizing NASCET criteria, using the distal internal carotid diameter as the denominator. CONTRAST:  83mL ISOVUE-370 IOPAMIDOL (ISOVUE-370) INJECTION 76% COMPARISON:  MRI head 06/11/2017 FINDINGS: Aortic arch: Mild atherosclerotic disease aortic arch and proximal great vessels. Mild stenosis origin left subclavian artery. Right carotid system: Right common carotid artery widely patent. Atherosclerotic plaque in the right carotid bulb which is noncalcified. Minimal luminal diameter 2.7 mm corresponding to 20% diameter stenosis. No filling defect Left carotid system: Left common carotid artery patent without significant stenosis. Noncalcified plaque left carotid bulb. Minimal luminal diameter 2.1 mm corresponding to 35% diameter stenosis. Just above the stenosis, there is a small linear filling defect within the lumen compatible with thrombus or intimal flap. This may be a source of emboli from the left MCA stroke. Vertebral arteries: Both vertebral arteries widely patent Skeleton: Spondylosis with prominent spurring C5-6 causing spinal stenosis. No acute skeletal abnormality. 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 Small linear filling defect in the internal carotid artery most likely thrombus or intimal flap and is a source of emboli causing left MCA stroke. Both vertebral arteries widely patent. These results will be called to the ordering clinician or representative by the Radiologist Assistant, and communication documented in the PACS or zVision Dashboard. Electronically Signed   By: Franchot Gallo M.D.   On: 06/12/2017 12:02   Mr Brain Wo Contrast  Result Date: 06/11/2017 CLINICAL DATA:  Initial evaluation for right-sided numbness, confusion. EXAM: MRI HEAD  WITHOUT CONTRAST MRA HEAD WITHOUT CONTRAST TECHNIQUE: Multiplanar, multiecho pulse sequences of the brain and surrounding structures were obtained without intravenous contrast. Angiographic images of the head were obtained using MRA technique without contrast. COMPARISON:  Prior CT from earlier the same day. FINDINGS: MRI HEAD FINDINGS Brain: Cerebral volume normal for age. Patchy T2/FLAIR hyperintensity within the periventricular and deep white matter both cerebral hemispheres, nonspecific, but most likely related chronic microvascular ischemic disease. Chronic microvascular changes present within the pons. Patchy multifocal acute ischemic cortical and subcortical infarcts present within the left frontal and parietal lobes, left MCA distribution (series 100, image 38). Faint petechial hemorrhage posteriorly without hemorrhagic transformation. No associated mass effect. No other evidence for acute or subacute ischemia. No mass lesion, midline shift or mass effect. No hydrocephalus. No extra-axial fluid collection. Major dural sinuses are grossly patent. Pituitary gland suprasellar region normal. Midline structures intact and normal. Vascular: Major intracranial vascular flow voids are maintained. Skull and upper cervical spine: Craniocervical junction normal. Upper cervical spine within normal limits. Bone marrow signal intensity normal. No scalp soft tissue abnormality. Sinuses/Orbits: Globes and orbital soft tissues normal. Paranasal sinuses clear. No mastoid effusion. Inner ear structures normal. Other: None. MRA HEAD FINDINGS ANTERIOR CIRCULATION: Study mildly degraded by motion. Distal cervical segments of the internal carotid arteries are patent with antegrade flow. Petrous, cavernous, and supraclinoid segments widely patent bilaterally. ICA termini patent. A1 segments patent. Normal anterior communicating artery. Anterior cerebral arteries widely patent to their distal aspects. M1 segments patent without  stenosis or occlusion. Normal MCA bifurcations. Distal MCA branches well perfused and symmetric. POSTERIOR CIRCULATION: Vertebral arteries patent to the vertebrobasilar junction without stenosis. Posterior inferior cerebral arteries patent bilaterally. Basilar artery widely patent. Superior cerebral arteries patent bilaterally. Both of  the posterior cerebral arteries primarily supplied via the basilar and are well perfused to their distal aspects. No aneurysm. IMPRESSION: MRI HEAD IMPRESSION:: 1. 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 Minimal faint petechial hemorrhage without hemorrhagic transformation or mass effect. 2. Underlying chronic small vessel ischemic changes, mild to moderate for age. MRA HEAD IMPRESSION: Negative intracranial MRA. No large vessel occlusion. No hemodynamically significant or correctable stenosis identified. Electronically Signed   By: Jeannine Boga M.D.   On: 06/11/2017 22:54   US Carotid Bilateral (at [facility] Only)  Result Date: 06/12/2017 CLINICAL DATA:  Stroke.  Memory loss. EXAM: BILATERAL CAROTID DUPLEX ULTRASOUND TECHNIQUE: Pearline Cables scale imaging, color Doppler and duplex ultrasound was performed of bilateral carotid and vertebral arteries in the neck. COMPARISON:  03/26/2002 by report only TECHNIQUE: Quantification of carotid stenosis is based on velocity parameters that correlate the residual internal carotid diameter with NASCET-based stenosis levels, using the diameter of the distal internal carotid lumen as the denominator for stenosis measurement. The following velocity measurements were obtained: PEAK SYSTOLIC/END DIASTOLIC RIGHT ICA:                     178/52cm/sec CCA:                     84/16SA/YTK SYSTOLIC ICA/CCA RATIO:  1.9 DIASTOLIC ICA/CCA RATIO: 2.3 ECA:                     160cm/sec LEFT ICA:                     253/41cm/sec CCA:                      160/10XN/ATF SYSTOLIC ICA/CCA RATIO:  2.4 DIASTOLIC ICA/CCA RATIO: 1.8 ECA:                     213cm/sec FINDINGS: RIGHT CAROTID ARTERY: Eccentric noncalcified plaque in the distal common carotid artery and bulb extending into the proximal ICA. No high-grade stenosis. Normal waveforms and color Doppler signal. RIGHT VERTEBRAL ARTERY:  Normal flow direction and waveform. LEFT CAROTID ARTERY: Eccentric noncalcified plaque in the carotid bulb extending into proximal internal and external carotid arteries. Focal stenosis in the proximal ICA with elevated peak systolic velocities just distally. Normal color Doppler signal and waveforms otherwise. LEFT VERTEBRAL ARTERY: Normal flow direction and waveform. IMPRESSION: 1. Bilateral carotid bifurcation and proximal ICA plaque, resulting in less than 50% diameter stenosis on the right, 50-69% diameter proximal ICA stenosis on the left. This represents progression of disease on the left since previous examination. 2.  Antegrade bilateral vertebral arterial flow. Electronically Signed   By: Lucrezia Europe M.D.   On: 06/12/2017 10:03   Dg Chest Portable 1 View  Result Date: 06/11/2017 CLINICAL DATA:  Right side weakness since waking up this morning. EXAM: PORTABLE CHEST 1 VIEW COMPARISON:  PA and lateral chest 03/22/2015 and 04/14/2014. FINDINGS: Lungs are clear. Heart size is normal. No pneumothorax or pleural fluid. No bony abnormality. IMPRESSION: Negative chest. Electronically Signed   By: Inge Rise M.D.   On: 06/11/2017 18:56   Mr Jodene Nam Head/brain TD Cm  Result Date: 06/11/2017 CLINICAL DATA:  Initial evaluation for right-sided numbness, confusion. EXAM: MRI HEAD WITHOUT CONTRAST MRA HEAD WITHOUT CONTRAST TECHNIQUE: Multiplanar, multiecho pulse sequences of the brain and surrounding structures  were obtained without intravenous contrast. Angiographic images of the head were obtained using MRA technique without contrast. COMPARISON:  Prior CT from earlier  the same day. FINDINGS: MRI HEAD FINDINGS Brain: Cerebral volume normal for age. Patchy T2/FLAIR hyperintensity within the periventricular and deep white matter both cerebral hemispheres, nonspecific, but most likely related chronic microvascular ischemic disease. Chronic microvascular changes present within the pons. Patchy multifocal acute ischemic cortical and subcortical infarcts present within the left frontal and parietal lobes, left MCA distribution (series 100, image 38). Faint petechial hemorrhage posteriorly without hemorrhagic transformation. No associated mass effect. No other evidence for acute or subacute ischemia. No mass lesion, midline shift or mass effect. No hydrocephalus. No extra-axial fluid collection. Major dural sinuses are grossly patent. Pituitary gland suprasellar region normal. Midline structures intact and normal. Vascular: Major intracranial vascular flow voids are maintained. Skull and upper cervical spine: Craniocervical junction normal. Upper cervical spine within normal limits. Bone marrow signal intensity normal. No scalp soft tissue abnormality. Sinuses/Orbits: Globes and orbital soft tissues normal. Paranasal sinuses clear. No mastoid effusion. Inner ear structures normal. Other: None. MRA HEAD FINDINGS ANTERIOR CIRCULATION: Study mildly degraded by motion. Distal cervical segments of the internal carotid arteries are patent with antegrade flow. Petrous, cavernous, and supraclinoid segments widely patent bilaterally. ICA termini patent. A1 segments patent. Normal anterior communicating artery. Anterior cerebral arteries widely patent to their distal aspects. M1 segments patent without stenosis or occlusion. Normal MCA bifurcations. Distal MCA branches well perfused and symmetric. POSTERIOR CIRCULATION: Vertebral arteries patent to the vertebrobasilar junction without stenosis. Posterior inferior cerebral arteries patent bilaterally. Basilar artery widely patent. Superior cerebral  arteries patent bilaterally. Both of the posterior cerebral arteries primarily supplied via the basilar and are well perfused to their distal aspects. No aneurysm. IMPRESSION: MRI HEAD IMPRESSION:: 1. 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 Minimal faint petechial hemorrhage without hemorrhagic transformation or mass effect. 2. Underlying chronic small vessel ischemic changes, mild to moderate for age. MRA HEAD IMPRESSION: Negative intracranial MRA. No large vessel occlusion. No hemodynamically significant or correctable stenosis identified. Electronically Signed   By: Jeannine Boga M.D.   On: 06/11/2017 22:54   ASSESSMENT AND PLAN:  Christabell Loseke  is a 57 y.o. female with a known history of anxiety, medication noncompliance and hyperlipidemia admitted for new stroke  1.  Acute CVA left frontal lobe -MRI shows multiple areas of acute infarct in the distribution of left MCA, small petechial hemorrhage noted as well -Per neurology this is likely embolic -Continue aspirin and Plavix for now per neurology -If her symptoms worsen, she will need transfer to [facility] under neuro hospitalist service for stroke intervention.  They are aware and will accept this patient   2.  Hyperlipidemia -Continue statins  3 chronic anxiety continue Xanax and buspirone  4.  Tobacco abuse Smoking cessation advised 4 minutes spent  5.  Elevated blood pressure without diagnosis of hypertension - allow permissive hypertension -PRN hydralazine for systolic greater than 323  Was discussed with patient patient's brother and husband at bedside. Also discussed case with Dr. Doy Mince from neurology       All the records are reviewed and case discussed with Care Management/Social Worker. Management plans discussed with the patient, family and they are in agreement.  CODE STATUS: Full  Code  TOTAL TIME TAKING CARE OF THIS PATIENT: 35 minutes.   More than 50% of the time was  spent in counseling/coordination of care: YES  POSSIBLE D/C IN 1 DAYS, DEPENDING ON CLINICAL CONDITION.   Max Sane M.D on 06/12/2017 at 4:03 PM  Between 7am to 6pm - Pager - [phone]  After 6pm go to [email or web address] - Proofreader  [facility]  Office  [phone]  CC: Primary care physician; Albina Billet, MD  Note: This dictation was prepared with Dragon dictation along with smaller phrase technology. Any transcriptional errors that result from this process are unintentional.

## 2017-06-12 NOTE — Progress Notes (Signed)
PT Cancellation Note  Patient Details Name: Jennifer Oconnor MRN: [id number] DOB: 04-17-1961   Cancelled Treatment:    Reason Eval/Treat Not Completed: (Evaluation re-attempted.  Patient currently with OT for evaluation.  Will continue efforts next date.)   Keishana Klinger H. Owens Shark, PT, DPT, NCS 06/12/17, 4:45 PM [phone]

## 2017-06-12 NOTE — Progress Notes (Addendum)
Speech Therapy Note: reviewed chart notes; consulted NSG then met w/ pt/husband. Pt was verbally conversive w/ appropriate speech and language for general conversation. Pt endorsed intermittent slowness in processing and recall of information. Pt has been eating and drinking in room w/ no reported swallowing issues by pt or husband. Pt admitted less than 24 hours ago; MRI indicated an evolving L frontal MCA; pt only had ~1.5 hours of sleep last night per husband's report. Due to these factors, ST will hold on performing an Cognitive-Linguistic evaluation at this time. Pt and husband agreed. ST services will f/u in the morning when pt has rested for assessment. NSG/MD updated.    Orinda Kenner, [place], CCC-SLP

## 2017-06-12 NOTE — Progress Notes (Addendum)
Rehab Admissions Coordinator Note:  Patient was screened by Cleatrice Burke for appropriateness for an Inpatient Acute Rehab admission per OT recommendation. Noted SLP to eval tomorrow due to pt with less than 1. 5 hrs of sleep and admission less than 24 hrs ago. PT eval pending. OT assessment noted pt Mod I with all adls .   At this time, we are recommending await further therapy evaluations before determining rehab venue options. Cleatrice Burke 06/12/2017, 6:55 PM  I can be reached at [phone].

## 2017-06-12 NOTE — Progress Notes (Signed)
PT Cancellation Note  Patient Details Name: Jennifer Oconnor MRN: [id number] DOB: 15-May-1961   Cancelled Treatment:    Reason Eval/Treat Not Completed: (Consult received and chart reviewed.  Patient currently with SLP for cog/language evaluation.  Will re-attempt at later time date as medically appropriate, patient available.)   Leather Estis H. Owens Shark, PT, DPT, NCS 06/12/17, 10:26 AM [phone]

## 2017-06-12 NOTE — Consult Note (Signed)
Referring Physician: Manuella Ghazi    Chief Complaint: Confusion  HPI: Jennifer Oconnor is an 57 y.o. female with a history of hypertension noncompliant with medications who reports awakening on yesterday and feeling as if she was not thinking clearly.  Patient also reports she started to notice some tingling in her right upper extremity.  Her husband returned home later and noted that she was not thinking clearly as well.  She was brought to the emergency room for evaluation at that time.  Initial NIH stroke scale of 1. Patient with a history of hypertension.  Patient reports she has been noncompliant with her medications.  Patient is on no antiplatelet therapy at home.  Date last known well: Date: 06/11/2017 Time last known well: Time: 00:00 tPA Given: No: Outside time window  Past Medical History:  Diagnosis Date  . Anxiety   . Diverticulitis    in past  . Hypercholesteremia   Miscarriage in the past  Past Surgical History:  Procedure Laterality Date  . ABDOMINAL HYSTERECTOMY  1993  . COLON SURGERY     colectomy 9/11  . COLONOSCOPY N/A 10/15/2014   Procedure: COLONOSCOPY;  Surgeon: Lucilla Lame, MD;  Location: [facility];  Service: Gastroenterology;  Laterality: N/A;  cecum time- 1003  . PILONIDAL CYST DRAINAGE  02/13/12  . POLYPECTOMY  10/15/2014   Procedure: POLYPECTOMY INTESTINAL;  Surgeon: Lucilla Lame, MD;  Location: [facility];  Service: Gastroenterology;;    Family history: Both parents deceased.  Both mother and father had strokes.  Social History:  reports that she has been smoking.  She has a 20.00 pack-year smoking history. she has never used smokeless tobacco. She reports that she does not drink alcohol or use drugs.  Allergies: No Known Allergies  Medications:  I have reviewed the patient's current medications. Prior to Admission:  Medications Prior to Admission  Medication Sig Dispense Refill Last Dose  . ALPRAZolam (XANAX) 0.5 MG tablet Take 0.5 mg by  mouth 2 (two) times daily as needed for anxiety.   06/11/2017 at AM  . buPROPion (WELLBUTRIN XL) 300 MG 24 hr tablet Take 300 mg by mouth daily. AM   06/11/2017 at AM  . estradiol (VIVELLE-DOT) 0.025 MG/24HR Place 1 patch onto the skin 2 (two) times a week. Wed/Sun   UTD at UTD   Scheduled: . aspirin  300 mg Rectal Daily   Or  . aspirin  325 mg Oral Daily  . buPROPion  300 mg Oral Daily  . enoxaparin (LOVENOX) injection  40 mg Subcutaneous Q24H    ROS: History obtained from the patient  General ROS: negative for - chills, fatigue, fever, night sweats, weight gain or weight loss Psychological ROS: negative for - behavioral disorder, hallucinations, memory difficulties, mood swings or suicidal ideation Ophthalmic ROS: negative for - blurry vision, double vision, eye pain or loss of vision ENT ROS: negative for - epistaxis, nasal discharge, oral lesions, sore throat, tinnitus or vertigo Allergy and Immunology ROS: negative for - hives or itchy/watery eyes Hematological and Lymphatic ROS: negative for - bleeding problems, bruising or swollen lymph nodes Endocrine ROS: negative for - galactorrhea, hair pattern changes, polydipsia/polyuria or temperature intolerance Respiratory ROS: negative for - cough, hemoptysis, shortness of breath or wheezing Cardiovascular ROS: negative for - chest pain, dyspnea on exertion, edema or irregular heartbeat Gastrointestinal ROS: negative for - abdominal pain, diarrhea, hematemesis, nausea/vomiting or stool incontinence Genito-Urinary ROS: negative for - dysuria, hematuria, incontinence or urinary frequency/urgency Musculoskeletal ROS: negative for -  joint swelling or muscular weakness Neurological ROS: as noted in HPI Dermatological ROS: negative for rash and skin lesion changes  Physical Examination: Blood pressure (!) 112/56, pulse 77, temperature 97.8 F (36.6 C), temperature source Oral, resp. rate 14, height 5' (1.524 m), weight 71.6 kg (157 lb 12.8 oz),  SpO2 99 %.  HEENT-  Normocephalic, no lesions, without obvious abnormality.  Normal external eye and conjunctiva.  Normal TM's bilaterally.  Normal auditory canals and external ears. Normal external nose, mucus membranes and septum.  Normal pharynx. Cardiovascular- S1, S2 normal, pulses palpable throughout   Lungs- chest clear, no wheezing, rales, normal symmetric air entry Abdomen- soft, non-tender; bowel sounds normal; no masses,  no organomegaly Extremities- no edema Lymph-no adenopathy palpable Musculoskeletal-no joint tenderness, deformity or swelling Skin-warm and dry, no hyperpigmentation, vitiligo, or suspicious lesions  Neurological Examination   Mental Status: Alert, oriented, thought content appropriate.  Speech fluent without evidence of aphasia but patient has some delay before responses as if she has to take some time to think.  Able to follow 3 step commands without difficulty. Cranial Nerves: II: Discs flat bilaterally; Visual fields grossly normal, pupils equal, round, reactive to light and accommodation III,IV, VI: ptosis not present, extra-ocular motions intact bilaterally V,VII: smile symmetric, facial light touch sensation normal bilaterally VIII: hearing normal bilaterally IX,X: gag reflex present XI: bilateral shoulder shrug XII: midline tongue extension Motor: Right : Upper extremity   4+/5  with pronator drift   Left:     Upper extremity   5/5  Lower extremity   5/5       Lower extremity   5/5 Tone and bulk:normal tone throughout; no atrophy noted Sensory: Pinprick and light touch intact throughout, bilaterally Deep Tendon Reflexes: 3+ and symmetric throughout Plantars: Right: equivocal   Left: downgoing Cerebellar: Normal finger-to-nose and normal heel-to-shin testing bilaterally Gait: not tested due to safety concerns   Laboratory Studies:  Basic Metabolic Panel: Recent Labs  Lab 06/11/17 1812  NA 137  K 4.8  CL 105  CO2 23  GLUCOSE 119*  BUN 16   CREATININE 1.24*  CALCIUM 9.7    Liver Function Tests: Recent Labs  Lab 06/11/17 1812  AST 31  ALT 18  ALKPHOS 90  BILITOT 1.1  PROT 7.8  ALBUMIN 4.5   No results for input(s): LIPASE, AMYLASE in the last 168 hours. No results for input(s): AMMONIA in the last 168 hours.  CBC: Recent Labs  Lab 06/11/17 1812  WBC 9.4  NEUTROABS 5.4  HGB 14.7  HCT 43.3  MCV 94.8  PLT 300    Cardiac Enzymes: No results for input(s): CKTOTAL, CKMB, CKMBINDEX, TROPONINI in the last 168 hours.  BNP: Invalid input(s): POCBNP  CBG: No results for input(s): GLUCAP in the last 168 hours.  Microbiology: No results found for this or any previous visit.  Coagulation Studies: No results for input(s): LABPROT, INR in the last 72 hours.  Urinalysis: No results for input(s): COLORURINE, LABSPEC, PHURINE, GLUCOSEU, HGBUR, BILIRUBINUR, KETONESUR, PROTEINUR, UROBILINOGEN, NITRITE, LEUKOCYTESUR in the last 168 hours.  Invalid input(s): APPERANCEUR  Lipid Panel:    Component Value Date/Time   CHOL 247 (H) 06/12/2017 0604   TRIG 755 (H) 06/12/2017 0604   HDL 32 (L) 06/12/2017 0604   CHOLHDL 7.7 06/12/2017 0604   VLDL UNABLE TO CALCULATE IF TRIGLYCERIDE OVER 400 mg/dL 06/12/2017 0604   LDLCALC UNABLE TO CALCULATE IF TRIGLYCERIDE OVER 400 mg/dL 06/12/2017 0604    HgbA1C: No results found for: HGBA1C  Urine Drug Screen:  No results found for: LABOPIA, COCAINSCRNUR, LABBENZ, AMPHETMU, THCU, LABBARB  Alcohol Level: No results for input(s): ETH in the last 168 hours.  Other results: EKG: sinus tachycardia at 103 bpm.  Imaging: Ct Head Wo Contrast  Result Date: 06/11/2017 CLINICAL DATA:  Acute presentation with right-sided weakness beginning this morning. EXAM: CT HEAD WITHOUT CONTRAST TECHNIQUE: Contiguous axial images were obtained from the base of the skull through the vertex without intravenous contrast. COMPARISON:  05/28/2017 FINDINGS: Brain: Question region of cortical and subcortical  low-density developing in the left frontal lobe axial images 15 through 18. No sign of swelling or hemorrhage. The remainder the brain appears normal. No hydrocephalus. No extra-axial collection. Vascular: No abnormal vascular finding. Skull: Normal Sinuses/Orbits: Clear/normal Other: None IMPRESSION: Question area of developing low-density in the left frontal lobe which could represent changes of acute infarction in a left MCA branch vessel. No swelling or hemorrhage. I do not think the findings are absolutely conclusive but they are suspicious. Electronically Signed   By: Nelson Chimes M.D.   On: 06/11/2017 18:51   Mr Brain Wo Contrast  Result Date: 06/11/2017 CLINICAL DATA:  Initial evaluation for right-sided numbness, confusion. EXAM: MRI HEAD WITHOUT CONTRAST MRA HEAD WITHOUT CONTRAST TECHNIQUE: Multiplanar, multiecho pulse sequences of the brain and surrounding structures were obtained without intravenous contrast. Angiographic images of the head were obtained using MRA technique without contrast. COMPARISON:  Prior CT from earlier the same day. FINDINGS: MRI HEAD FINDINGS Brain: Cerebral volume normal for age. Patchy T2/FLAIR hyperintensity within the periventricular and deep white matter both cerebral hemispheres, nonspecific, but most likely related chronic microvascular ischemic disease. Chronic microvascular changes present within the pons. Patchy multifocal acute ischemic cortical and subcortical infarcts present within the left frontal and parietal lobes, left MCA distribution (series 100, image 38). Faint petechial hemorrhage posteriorly without hemorrhagic transformation. No associated mass effect. No other evidence for acute or subacute ischemia. No mass lesion, midline shift or mass effect. No hydrocephalus. No extra-axial fluid collection. Major dural sinuses are grossly patent. Pituitary gland suprasellar region normal. Midline structures intact and normal. Vascular: Major intracranial vascular  flow voids are maintained. Skull and upper cervical spine: Craniocervical junction normal. Upper cervical spine within normal limits. Bone marrow signal intensity normal. No scalp soft tissue abnormality. Sinuses/Orbits: Globes and orbital soft tissues normal. Paranasal sinuses clear. No mastoid effusion. Inner ear structures normal. Other: None. MRA HEAD FINDINGS ANTERIOR CIRCULATION: Study mildly degraded by motion. Distal cervical segments of the internal carotid arteries are patent with antegrade flow. Petrous, cavernous, and supraclinoid segments widely patent bilaterally. ICA termini patent. A1 segments patent. Normal anterior communicating artery. Anterior cerebral arteries widely patent to their distal aspects. M1 segments patent without stenosis or occlusion. Normal MCA bifurcations. Distal MCA branches well perfused and symmetric. POSTERIOR CIRCULATION: Vertebral arteries patent to the vertebrobasilar junction without stenosis. Posterior inferior cerebral arteries patent bilaterally. Basilar artery widely patent. Superior cerebral arteries patent bilaterally. Both of the posterior cerebral arteries primarily supplied via the basilar and are well perfused to their distal aspects. No aneurysm. IMPRESSION: MRI HEAD IMPRESSION:: 1. 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 Minimal faint petechial hemorrhage without hemorrhagic transformation or mass effect. 2. Underlying chronic small vessel ischemic changes, mild to moderate for age. MRA HEAD IMPRESSION: Negative intracranial MRA. No large vessel occlusion. No hemodynamically significant or correctable stenosis identified. Electronically Signed   By: Marland Kitchen  Jeannine Boga M.D.   On: 06/11/2017 22:54   US Carotid Bilateral (at [facility] Only)  Result Date: 06/12/2017 CLINICAL DATA:  Stroke.  Memory loss. EXAM: BILATERAL CAROTID DUPLEX  ULTRASOUND TECHNIQUE: Pearline Cables scale imaging, color Doppler and duplex ultrasound was performed of bilateral carotid and vertebral arteries in the neck. COMPARISON:  03/26/2002 by report only TECHNIQUE: Quantification of carotid stenosis is based on velocity parameters that correlate the residual internal carotid diameter with NASCET-based stenosis levels, using the diameter of the distal internal carotid lumen as the denominator for stenosis measurement. The following velocity measurements were obtained: PEAK SYSTOLIC/END DIASTOLIC RIGHT ICA:                     178/52cm/sec CCA:                     31/51VO/HYW SYSTOLIC ICA/CCA RATIO:  1.9 DIASTOLIC ICA/CCA RATIO: 2.3 ECA:                     160cm/sec LEFT ICA:                     253/41cm/sec CCA:                     737/10GY/IRS SYSTOLIC ICA/CCA RATIO:  2.4 DIASTOLIC ICA/CCA RATIO: 1.8 ECA:                     213cm/sec FINDINGS: RIGHT CAROTID ARTERY: Eccentric noncalcified plaque in the distal common carotid artery and bulb extending into the proximal ICA. No high-grade stenosis. Normal waveforms and color Doppler signal. RIGHT VERTEBRAL ARTERY:  Normal flow direction and waveform. LEFT CAROTID ARTERY: Eccentric noncalcified plaque in the carotid bulb extending into proximal internal and external carotid arteries. Focal stenosis in the proximal ICA with elevated peak systolic velocities just distally. Normal color Doppler signal and waveforms otherwise. LEFT VERTEBRAL ARTERY: Normal flow direction and waveform. IMPRESSION: 1. Bilateral carotid bifurcation and proximal ICA plaque, resulting in less than 50% diameter stenosis on the right, 50-69% diameter proximal ICA stenosis on the left. This represents progression of disease on the left since previous examination. 2.  Antegrade bilateral vertebral arterial flow. Electronically Signed   By: Lucrezia Europe M.D.   On: 06/12/2017 10:03   Dg Chest Portable 1 View  Result Date: 06/11/2017 CLINICAL DATA:  Right side  weakness since waking up this morning. EXAM: PORTABLE CHEST 1 VIEW COMPARISON:  PA and lateral chest 03/22/2015 and 04/14/2014. FINDINGS: Lungs are clear. Heart size is normal. No pneumothorax or pleural fluid. No bony abnormality. IMPRESSION: Negative chest. Electronically Signed   By: Inge Rise M.D.   On: 06/11/2017 18:56   Mr Jodene Nam Head/brain WN Cm  Result Date: 06/11/2017 CLINICAL DATA:  Initial evaluation for right-sided numbness, confusion. EXAM: MRI HEAD WITHOUT CONTRAST MRA HEAD WITHOUT CONTRAST TECHNIQUE: Multiplanar, multiecho pulse sequences of the brain and surrounding structures were obtained without intravenous contrast. Angiographic images of the head were obtained using MRA technique without contrast. COMPARISON:  Prior CT from earlier the same day. FINDINGS: MRI HEAD FINDINGS Brain: Cerebral volume normal for age. Patchy T2/FLAIR hyperintensity within the periventricular and deep white matter both cerebral hemispheres, nonspecific, but most likely related chronic microvascular ischemic disease. Chronic microvascular changes present within the pons. Patchy multifocal acute ischemic cortical and subcortical infarcts present within the left frontal and parietal lobes, left MCA distribution (series 100, image 38). Faint petechial  hemorrhage posteriorly without hemorrhagic transformation. No associated mass effect. No other evidence for acute or subacute ischemia. No mass lesion, midline shift or mass effect. No hydrocephalus. No extra-axial fluid collection. Major dural sinuses are grossly patent. Pituitary gland suprasellar region normal. Midline structures intact and normal. Vascular: Major intracranial vascular flow voids are maintained. Skull and upper cervical spine: Craniocervical junction normal. Upper cervical spine within normal limits. Bone marrow signal intensity normal. No scalp soft tissue abnormality. Sinuses/Orbits: Globes and orbital soft tissues normal. Paranasal sinuses clear.  No mastoid effusion. Inner ear structures normal. Other: None. MRA HEAD FINDINGS ANTERIOR CIRCULATION: Study mildly degraded by motion. Distal cervical segments of the internal carotid arteries are patent with antegrade flow. Petrous, cavernous, and supraclinoid segments widely patent bilaterally. ICA termini patent. A1 segments patent. Normal anterior communicating artery. Anterior cerebral arteries widely patent to their distal aspects. M1 segments patent without stenosis or occlusion. Normal MCA bifurcations. Distal MCA branches well perfused and symmetric. POSTERIOR CIRCULATION: Vertebral arteries patent to the vertebrobasilar junction without stenosis. Posterior inferior cerebral arteries patent bilaterally. Basilar artery widely patent. Superior cerebral arteries patent bilaterally. Both of the posterior cerebral arteries primarily supplied via the basilar and are well perfused to their distal aspects. No aneurysm. IMPRESSION: MRI HEAD IMPRESSION:: 1. 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 Minimal faint petechial hemorrhage without hemorrhagic transformation or mass effect. 2. Underlying chronic small vessel ischemic changes, mild to moderate for age. MRA HEAD IMPRESSION: Negative intracranial MRA. No large vessel occlusion. No hemodynamically significant or correctable stenosis identified. Electronically Signed   By: Jeannine Boga M.D.   On: 06/11/2017 22:54    Assessment: 57 y.o. female with a history of HLD and HTN, noncompliant with medications, who presents with confusion and right upper extremity symptoms.  Patient reports that although her numbness is improved today she continues to have difficulty thinking.  Weakness is noted on her right upper extremity.  She is on no antiplatelet therapy at home.  MRI of the brain reviewed and shows multiple areas of acute infarct in the  distribution of the left MCA.  There is some small petechial hemorrhage noted as well.  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  MRA of the head shows no large vessel occlusion or evidence of hemodynamically significant stenosis.  Carotid Dopplers showed less than 50% diameter stenosis on the right and 50-69% diameter stenosis in the left ICA.  Echocardiogram pending.  LDL unable to be calculated due to triglycerides of 755.  A1c 5.4.  Stroke Risk Factors - hyperlipidemia, hypertension and smoking  Plan: 1. PT consult, OT consult, Speech consult 2. Echocardiogram pending 3. CTA of the neck 4. Prophylactic therapy-Antiplatelet med: Aspirin - dose 325mg  daily 5. Telemetry monitoring 6. Frequent neuro checks 7. Statin for lipid management with target LDL<70. 8. Medication compliance stressed 9. Smoking cessation counseling 10. Although patient with multiple risk factors hypercoagulable work up ordered due to age and likely embolic etiology.   Alexis Goodell, MD Neurology [phone] 06/12/2017, 10:29 AM

## 2017-06-13 DIAGNOSIS — I639 Cerebral infarction, unspecified: Secondary | ICD-10-CM | POA: Diagnosis not present

## 2017-06-13 LAB — PROTEIN S, TOTAL: Protein S Ag, Total: 129 % (ref 60–150)

## 2017-06-13 LAB — HOMOCYSTEINE: Homocysteine: 19 umol/L — ABNORMAL HIGH (ref 0.0–15.0)

## 2017-06-13 LAB — TYPE AND SCREEN
ABO/RH(D): O POS
Antibody Screen: NEGATIVE

## 2017-06-13 LAB — LUPUS ANTICOAGULANT PANEL
DRVVT: 45.7 s (ref 0.0–47.0)
PTT LA: 40.6 s (ref 0.0–51.9)

## 2017-06-13 MED ORDER — CEFAZOLIN SODIUM-DEXTROSE 1-4 GM/50ML-% IV SOLN
1.0000 g | INTRAVENOUS | Status: AC
  Administered 2017-06-14: 1 g via INTRAVENOUS
  Filled 2017-06-13: qty 50

## 2017-06-13 MED ORDER — ATORVASTATIN CALCIUM 20 MG PO TABS
80.0000 mg | ORAL_TABLET | Freq: Every day | ORAL | Status: DC
  Administered 2017-06-13 – 2017-06-15 (×2): 80 mg via ORAL
  Filled 2017-06-13: qty 8
  Filled 2017-06-13 (×2): qty 4

## 2017-06-13 NOTE — Progress Notes (Signed)
OT Cancellation Note  Patient Details Name: Jennifer Oconnor MRN: [id number] DOB: [birth year]   Cancelled Treatment:    Reason Eval/Treat Not Completed: Patient at procedure or test/ unavailable. Upon attempt, pt with SLP for assessment. Will re-attempt at later date/time as pt is available.  [name], MPH, MS, OTR/L ascom [phone] 06/13/17, 3:23 PM

## 2017-06-13 NOTE — Evaluation (Signed)
Physical Therapy Evaluation Patient Details Name: Jennifer Oconnor MRN: [id number] DOB: 1960-07-04 Today's Date: 06/13/2017   History of Present Illness  57 y.o. female with a history of HTN noncompliant with medications who reports awakening 1/8 and feeling as if she was not thinking clearly and started to notice some tingling in her right upper extremity. Initial NIH stroke scale of 1.  MRI significant for multifocal acute infarct to L frontal/parietal lobes with faint petechial hemorrhage; CTA significant for filling defect to L ICA (likely source of embolism).  Per attending, plan to manage with medication at this time.  Clinical Impression  Upon evaluation, patient alert and oriented to basic information; follows simple commands, but demonstrates significant deficits with higher-level cognitive activities (problem-solving, sequencing, non-automatic skills).  Dep assist for use of compensatory strategies to complete simple tasks; question carry-over of information at this time due to noted memory deficits.  Intermittent word-finding difficulties, though able to maintain fluent conversation with automatic speech tasks.  Bilat UE/LE strength and ROM grossly functional for mobility; mild coordination and strength deficits in R UE, minimal/no deficit appreciated in LEs.  Demonstrates ability to complete all mobility tasks at mod indep level without assist device, no significant safety concerns; modified DGI 12/12 and BERG 45/56 (limited in part by cognitive deficits). No acute PT needs identified at this time; however, do recommend 24/7 supervision due to cognitive deficits with extensive outpatient SLP follow up for cognitive assessment/intervention. Initial order completed; patient/family in agreement.  Please re-consult should needs change.    Follow Up Recommendations No PT follow up    Equipment Recommendations       Recommendations for Other Services       Precautions / Restrictions  Restrictions Weight Bearing Restrictions: No      Mobility  Bed Mobility Overal bed mobility: Independent                Transfers Overall transfer level: Modified independent Equipment used: None                Ambulation/Gait Ambulation/Gait assistance: Modified independent (Device/Increase time) Ambulation Distance (Feet): 400 Feet Assistive device: None       General Gait Details: reciprocal stepping pattern with good trunk rotation, arm swing and overall gait symmetry.  Completes dynamic gait components without difficulty, LOB or safety concern.  Stairs            Wheelchair Mobility    Modified Rankin (Stroke Patients Only)       Balance Overall balance assessment: Needs assistance Sitting-balance support: No upper extremity supported;Feet supported Sitting balance-Leahy Scale: Normal     Standing balance support: No upper extremity supported Standing balance-Leahy Scale: Good   Single Leg Stance - Right Leg: 10 Single Leg Stance - Left Leg: 10             Standardized Balance Assessment Standardized Balance Assessment : Berg Balance Test Berg Balance Test Sit to Stand: Able to stand without using hands and stabilize independently Standing Unsupported: Able to stand safely 2 minutes Sitting with Back Unsupported but Feet Supported on Floor or Stool: Able to sit safely and securely 2 minutes Stand to Sit: Sits safely with minimal use of hands Transfers: Able to transfer safely, minor use of hands Standing Unsupported with Eyes Closed: Able to stand 10 seconds safely Standing Ubsupported with Feet Together: Able to place feet together independently and stand for 1 minute with supervision From Standing, Reach Forward with Outstretched Arm: Can reach forward >5  cm safely (2")(mild difficulty understanding and initiating task) From Standing Position, Pick up Object from Floor: Able to pick up shoe safely and easily From Standing  Position, Turn to Look Behind Over each Shoulder: Looks behind one side only/other side shows less weight shift Turn 360 Degrees: Able to turn 360 degrees safely but slowly Standing Unsupported, Alternately Place Feet on Step/Stool: Able to complete 4 steps without aid or supervision Standing Unsupported, One Foot in Front: Able to take small step independently and hold 30 seconds Standing on One Leg: Able to lift leg independently and hold 5-10 seconds Total Score: 45         Pertinent Vitals/Pain Pain Assessment: No/denies pain    Home Living Family/patient expects to be discharged to:: Private residence Living Arrangements: Spouse/significant other Available Help at Discharge: Family;Available PRN/intermittently Type of Home: House Home Access: Stairs to enter Entrance Stairs-Rails: Can reach both;Left;Right Entrance Stairs-Number of Steps: 3 Home Layout: One level Home Equipment: None Additional Comments: Husband aware of need for 24/7 supervision upon discharge; working to have this set up in home.    Prior Function Level of Independence: Independent         Comments: Indep with ADLs, household and community mobility wihtout assist device; denies fall history outside of this event.     Hand Dominance   Dominant Hand: Right    Extremity/Trunk Assessment   Upper Extremity Assessment Upper Extremity Assessment: (R UE with mild strength deficits; decreased speed/coordination (though improved from previous date))    Lower Extremity Assessment Lower Extremity Assessment: Overall WFL for tasks assessed(bilat LEs grossly 4+/5 throughout; denies sensory deficit.  No focal weakness appreciated.  + babinski R LE)       Communication   Communication: (intermittent word-finding difficulties (greater with confrontational speech))  Cognition Arousal/Alertness: Awake/alert Behavior During Therapy: WFL for tasks assessed/performed Overall Cognitive Status: Impaired/Different  from baseline                                 General Comments: significant impairment in problem-solving, sequencing and recall of new information      General Comments      Exercises Other Exercises Other Exercises: Scavenger hunt around unit, ambulatory without assist device, mod indep for functional ability.  Good, safe mobility efforts, but extensive assist required for cognitive components of activity.  Patient requiring dep cuing/assist for problem-solving and use of compensatory strategies when attempting to negotiate environment and complete simple cognitive activities.  discussed performance with SLP Other Exercises: Reviewed strategies to assist with noted cognitive deficits, activities to complete as HEP upon discharge; patient/husband voiced understanding.   Assessment/Plan    PT Assessment Patent does not need any further PT services  PT Problem List         PT Treatment Interventions      PT Goals (Current goals can be found in the Care Plan section)  Acute Rehab PT Goals PT Goal Formulation: All assessment and education complete, DC therapy Time For Goal Achievement: 06/13/17 Potential to Achieve Goals: Good    Frequency     Barriers to discharge        Co-evaluation               AM-PAC PT "6 Clicks" Daily Activity  Outcome Measure Difficulty turning over in bed (including adjusting bedclothes, sheets and blankets)?: None Difficulty moving from lying on back to sitting on the side of the bed? :  None Difficulty sitting down on and standing up from a chair with arms (e.g., wheelchair, bedside commode, etc,.)?: None Help needed moving to and from a bed to chair (including a wheelchair)?: None Help needed walking in hospital room?: None Help needed climbing 3-5 steps with a railing? : None 6 Click Score: 24    End of Session Equipment Utilized During Treatment: Gait belt Activity Tolerance: Patient tolerated treatment well Patient  left: in bed;with call bell/phone within reach;with bed alarm set;with family/visitor present Nurse Communication: Mobility status PT Visit Diagnosis: Hemiplegia and hemiparesis Hemiplegia - Right/Left: Right Hemiplegia - dominant/non-dominant: Dominant Hemiplegia - caused by: Cerebral infarction    Time: 0912-0945 PT Time Calculation (min) (ACUTE ONLY): 33 min   Charges:   PT Evaluation $PT Eval Low Complexity: 1 Low PT Treatments $Therapeutic Activity: 8-22 mins $Neuromuscular Re-education: 8-22 mins   PT G Codes:        Veralyn Lopp H. Owens Shark, PT, DPT, NCS 06/13/17, 10:14 AM [phone]

## 2017-06-13 NOTE — Progress Notes (Signed)
Subjective: Patient has remained stable overnight.  No new neurological complaints.    Objective: Current vital signs: BP (!) 129/56 (BP Location: Right Arm)   Pulse 80   Temp 97.9 F (36.6 C) (Oral)   Resp 14   Ht 5' (1.524 m)   Wt 71.6 kg (157 lb 12.8 oz)   SpO2 97%   BMI 30.82 kg/m  Vital signs in last 24 hours: Temp:  [97.7 F (36.5 C)-98.6 F (37 C)] 97.9 F (36.6 C) (01/10 0350) Pulse Rate:  [80-87] 80 (01/10 0350) Resp:  [14-20] 14 (01/10 0350) BP: (129-150)/(56-62) 129/56 (01/10 0350) SpO2:  [96 %-98 %] 97 % (01/10 0350)  Intake/Output from previous day: 01/09 0701 - 01/10 0700 In: 240 [P.O.:240] Out: -  Intake/Output this shift: Total I/O In: 240 [P.O.:240] Out: -  Nutritional status: Diet regular Room service appropriate? Yes; Fluid consistency: Thin  Neurologic Exam: Mental Status: Alert, oriented, thought content appropriate.  Speech fluent without evidence of aphasia but patient has some delay before responses as if she has to take some time to think.  Able to follow 3 step commands without difficulty. Cranial Nerves: II: Discs flat bilaterally; Visual fields grossly normal, pupils equal, round, reactive to light and accommodation III,IV, VI: ptosis not present, extra-ocular motions intact bilaterally V,VII: smile symmetric, facial light touch sensation normal bilaterally VIII: hearing normal bilaterally IX,X: gag reflex present XI: bilateral shoulder shrug XII: midline tongue extension Motor: Right :  Upper extremity   4+/5             with pronator drift                               Left:     Upper extremity   5/5             Lower extremity   5/5                                                                          Lower extremity   5/5   Lab Results: Basic Metabolic Panel: Recent Labs  Lab 06/11/17 1812  NA 137  K 4.8  CL 105  CO2 23  GLUCOSE 119*  BUN 16  CREATININE 1.24*  CALCIUM 9.7    Liver Function Tests: Recent Labs  Lab  06/11/17 1812  AST 31  ALT 18  ALKPHOS 90  BILITOT 1.1  PROT 7.8  ALBUMIN 4.5   No results for input(s): LIPASE, AMYLASE in the last 168 hours. No results for input(s): AMMONIA in the last 168 hours.  CBC: Recent Labs  Lab 06/11/17 1812  WBC 9.4  NEUTROABS 5.4  HGB 14.7  HCT 43.3  MCV 94.8  PLT 300    Cardiac Enzymes: No results for input(s): CKTOTAL, CKMB, CKMBINDEX, TROPONINI in the last 168 hours.  Lipid Panel: Recent Labs  Lab 06/11/17 1812 06/12/17 0604  CHOL 247* 247*  TRIG 512* 755*  HDL 35* 32*  CHOLHDL 7.1 7.7  VLDL UNABLE TO CALCULATE IF TRIGLYCERIDE OVER 400 mg/dL UNABLE TO CALCULATE IF TRIGLYCERIDE OVER 400 mg/dL  LDLCALC UNABLE TO CALCULATE IF TRIGLYCERIDE OVER 400 mg/dL UNABLE TO CALCULATE IF  TRIGLYCERIDE OVER 400 mg/dL    CBG: No results for input(s): GLUCAP in the last 168 hours.  Microbiology: No results found for this or any previous visit.  Coagulation Studies: No results for input(s): LABPROT, INR in the last 72 hours.  Imaging: Ct Head Wo Contrast  Result Date: 06/11/2017 CLINICAL DATA:  Acute presentation with right-sided weakness beginning this morning. EXAM: CT HEAD WITHOUT CONTRAST TECHNIQUE: Contiguous axial images were obtained from the base of the skull through the vertex without intravenous contrast. COMPARISON:  05/28/2017 FINDINGS: Brain: Question region of cortical and subcortical low-density developing in the left frontal lobe axial images 15 through 18. No sign of swelling or hemorrhage. The remainder the brain appears normal. No hydrocephalus. No extra-axial collection. Vascular: No abnormal vascular finding. Skull: Normal Sinuses/Orbits: Clear/normal Other: None IMPRESSION: Question area of developing low-density in the left frontal lobe which could represent changes of acute infarction in a left MCA branch vessel. No swelling or hemorrhage. I do not think the findings are absolutely conclusive but they are suspicious.  Electronically Signed   By: Nelson Chimes M.D.   On: 06/11/2017 18:51   Ct Angio Neck W Or Wo Contrast  Result Date: 06/12/2017 CLINICAL DATA:  Stroke EXAM: CT ANGIOGRAPHY NECK TECHNIQUE: Multidetector CT imaging of the neck was performed using the standard protocol during bolus administration of intravenous contrast. Multiplanar CT image reconstructions and MIPs were obtained to evaluate the vascular anatomy. Carotid stenosis measurements (when applicable) are obtained utilizing NASCET criteria, using the distal internal carotid diameter as the denominator. CONTRAST:  37mL ISOVUE-370 IOPAMIDOL (ISOVUE-370) INJECTION 76% COMPARISON:  MRI head 06/11/2017 FINDINGS: Aortic arch: Mild atherosclerotic disease aortic arch and proximal great vessels. Mild stenosis origin left subclavian artery. Right carotid system: Right common carotid artery widely patent. Atherosclerotic plaque in the right carotid bulb which is noncalcified. Minimal luminal diameter 2.7 mm corresponding to 20% diameter stenosis. No filling defect Left carotid system: Left common carotid artery patent without significant stenosis. Noncalcified plaque left carotid bulb. Minimal luminal diameter 2.1 mm corresponding to 35% diameter stenosis. Just above the stenosis, there is a small linear filling defect within the lumen compatible with thrombus or intimal flap. This may be a source of emboli from the left MCA stroke. Vertebral arteries: Both vertebral arteries widely patent Skeleton: Spondylosis with prominent spurring C5-6 causing spinal stenosis. No acute skeletal abnormality. 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 Small linear filling defect in the internal carotid artery most likely thrombus or intimal flap and is a source of emboli causing left MCA stroke. Both vertebral arteries widely patent. These results  will be called to the ordering clinician or representative by the Radiologist Assistant, and communication documented in the PACS or zVision Dashboard. Electronically Signed   By: Franchot Gallo M.D.   On: 06/12/2017 12:02   Mr Brain Wo Contrast  Result Date: 06/11/2017 CLINICAL DATA:  Initial evaluation for right-sided numbness, confusion. EXAM: MRI HEAD WITHOUT CONTRAST MRA HEAD WITHOUT CONTRAST TECHNIQUE: Multiplanar, multiecho pulse sequences of the brain and surrounding structures were obtained without intravenous contrast. Angiographic images of the head were obtained using MRA technique without contrast. COMPARISON:  Prior CT from earlier the same day. FINDINGS: MRI HEAD FINDINGS Brain: Cerebral volume normal for age. Patchy T2/FLAIR hyperintensity within the periventricular and deep white matter both cerebral hemispheres, nonspecific, but most likely related chronic microvascular ischemic disease. Chronic microvascular changes present  within the pons. Patchy multifocal acute ischemic cortical and subcortical infarcts present within the left frontal and parietal lobes, left MCA distribution (series 100, image 38). Faint petechial hemorrhage posteriorly without hemorrhagic transformation. No associated mass effect. No other evidence for acute or subacute ischemia. No mass lesion, midline shift or mass effect. No hydrocephalus. No extra-axial fluid collection. Major dural sinuses are grossly patent. Pituitary gland suprasellar region normal. Midline structures intact and normal. Vascular: Major intracranial vascular flow voids are maintained. Skull and upper cervical spine: Craniocervical junction normal. Upper cervical spine within normal limits. Bone marrow signal intensity normal. No scalp soft tissue abnormality. Sinuses/Orbits: Globes and orbital soft tissues normal. Paranasal sinuses clear. No mastoid effusion. Inner ear structures normal. Other: None. MRA HEAD FINDINGS ANTERIOR CIRCULATION: Study  mildly degraded by motion. Distal cervical segments of the internal carotid arteries are patent with antegrade flow. Petrous, cavernous, and supraclinoid segments widely patent bilaterally. ICA termini patent. A1 segments patent. Normal anterior communicating artery. Anterior cerebral arteries widely patent to their distal aspects. M1 segments patent without stenosis or occlusion. Normal MCA bifurcations. Distal MCA branches well perfused and symmetric. POSTERIOR CIRCULATION: Vertebral arteries patent to the vertebrobasilar junction without stenosis. Posterior inferior cerebral arteries patent bilaterally. Basilar artery widely patent. Superior cerebral arteries patent bilaterally. Both of the posterior cerebral arteries primarily supplied via the basilar and are well perfused to their distal aspects. No aneurysm. IMPRESSION: MRI HEAD IMPRESSION:: 1. 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 Minimal faint petechial hemorrhage without hemorrhagic transformation or mass effect. 2. Underlying chronic small vessel ischemic changes, mild to moderate for age. MRA HEAD IMPRESSION: Negative intracranial MRA. No large vessel occlusion. No hemodynamically significant or correctable stenosis identified. Electronically Signed   By: Jeannine Boga M.D.   On: 06/11/2017 22:54   US Carotid Bilateral (at [facility] And [facility] Only)  Result Date: 06/12/2017 CLINICAL DATA:  Stroke.  Memory loss. EXAM: BILATERAL CAROTID DUPLEX ULTRASOUND TECHNIQUE: Pearline Cables scale imaging, color Doppler and duplex ultrasound was performed of bilateral carotid and vertebral arteries in the neck. COMPARISON:  03/26/2002 by report only TECHNIQUE: Quantification of carotid stenosis is based on velocity parameters that correlate the residual internal carotid diameter with NASCET-based stenosis levels, using the diameter of the distal internal carotid  lumen as the denominator for stenosis measurement. The following velocity measurements were obtained: PEAK SYSTOLIC/END DIASTOLIC RIGHT ICA:                     178/52cm/sec CCA:                     [id number] SYSTOLIC ICA/CCA RATIO:  1.9 DIASTOLIC ICA/CCA RATIO: 2.3 ECA:                     160cm/sec LEFT ICA:                     253/41cm/sec CCA:                     [id number] SYSTOLIC ICA/CCA RATIO:  2.4 DIASTOLIC ICA/CCA RATIO: 1.8 ECA:                     213cm/sec FINDINGS: RIGHT CAROTID ARTERY: Eccentric noncalcified plaque in the distal common carotid artery and bulb extending into the proximal ICA. No high-grade stenosis. Normal waveforms and color Doppler signal. RIGHT  VERTEBRAL ARTERY:  Normal flow direction and waveform. LEFT CAROTID ARTERY: Eccentric noncalcified plaque in the carotid bulb extending into proximal internal and external carotid arteries. Focal stenosis in the proximal ICA with elevated peak systolic velocities just distally. Normal color Doppler signal and waveforms otherwise. LEFT VERTEBRAL ARTERY: Normal flow direction and waveform. IMPRESSION: 1. Bilateral carotid bifurcation and proximal ICA plaque, resulting in less than 50% diameter stenosis on the right, 50-69% diameter proximal ICA stenosis on the left. This represents progression of disease on the left since previous examination. 2.  Antegrade bilateral vertebral arterial flow. Electronically Signed   By: Lucrezia Europe M.D.   On: 06/12/2017 10:03   Dg Chest Portable 1 View  Result Date: 06/11/2017 CLINICAL DATA:  Right side weakness since waking up this morning. EXAM: PORTABLE CHEST 1 VIEW COMPARISON:  PA and lateral chest 03/22/2015 and 04/14/2014. FINDINGS: Lungs are clear. Heart size is normal. No pneumothorax or pleural fluid. No bony abnormality. IMPRESSION: Negative chest. Electronically Signed   By: Inge Rise M.D.   On: 06/11/2017 18:56   Mr Jodene Nam Head/brain TF Cm  Result Date: 06/11/2017 CLINICAL DATA:  Initial  evaluation for right-sided numbness, confusion. EXAM: MRI HEAD WITHOUT CONTRAST MRA HEAD WITHOUT CONTRAST TECHNIQUE: Multiplanar, multiecho pulse sequences of the brain and surrounding structures were obtained without intravenous contrast. Angiographic images of the head were obtained using MRA technique without contrast. COMPARISON:  Prior CT from earlier the same day. FINDINGS: MRI HEAD FINDINGS Brain: Cerebral volume normal for age. Patchy T2/FLAIR hyperintensity within the periventricular and deep white matter both cerebral hemispheres, nonspecific, but most likely related chronic microvascular ischemic disease. Chronic microvascular changes present within the pons. Patchy multifocal acute ischemic cortical and subcortical infarcts present within the left frontal and parietal lobes, left MCA distribution (series 100, image 38). Faint petechial hemorrhage posteriorly without hemorrhagic transformation. No associated mass effect. No other evidence for acute or subacute ischemia. No mass lesion, midline shift or mass effect. No hydrocephalus. No extra-axial fluid collection. Major dural sinuses are grossly patent. Pituitary gland suprasellar region normal. Midline structures intact and normal. Vascular: Major intracranial vascular flow voids are maintained. Skull and upper cervical spine: Craniocervical junction normal. Upper cervical spine within normal limits. Bone marrow signal intensity normal. No scalp soft tissue abnormality. Sinuses/Orbits: Globes and orbital soft tissues normal. Paranasal sinuses clear. No mastoid effusion. Inner ear structures normal. Other: None. MRA HEAD FINDINGS ANTERIOR CIRCULATION: Study mildly degraded by motion. Distal cervical segments of the internal carotid arteries are patent with antegrade flow. Petrous, cavernous, and supraclinoid segments widely patent bilaterally. ICA termini patent. A1 segments patent. Normal anterior communicating artery. Anterior cerebral arteries widely  patent to their distal aspects. M1 segments patent without stenosis or occlusion. Normal MCA bifurcations. Distal MCA branches well perfused and symmetric. POSTERIOR CIRCULATION: Vertebral arteries patent to the vertebrobasilar junction without stenosis. Posterior inferior cerebral arteries patent bilaterally. Basilar artery widely patent. Superior cerebral arteries patent bilaterally. Both of the posterior cerebral arteries primarily supplied via the basilar and are well perfused to their distal aspects. No aneurysm. IMPRESSION: MRI HEAD IMPRESSION:: 1. 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 Minimal faint petechial hemorrhage without hemorrhagic transformation or mass effect. 2. Underlying chronic small vessel ischemic changes, mild to moderate for age. MRA HEAD IMPRESSION: Negative intracranial MRA. No large vessel occlusion. No hemodynamically significant or correctable stenosis identified. Electronically Signed   By: Pincus Badder.D.  On: 06/11/2017 22:54    Medications:  I have reviewed the patient's current medications. Scheduled: . aspirin EC  81 mg Oral Daily  . atorvastatin  80 mg Oral q1800  . buPROPion  300 mg Oral Daily  . clopidogrel  75 mg Oral Daily  . enoxaparin (LOVENOX) injection  40 mg Subcutaneous Q24H    Assessment/Plan: Patient with no new neurological complaints overnight.  CTA of the neck reviewed on yesterday.  CT of the neck shows 20% diameter stenosis in the proximal right internal carotid artery.  There is felt to be 35% diameter stenosis in the proximal left internal carotid artery with a small linear filling defect representing either thrombus or intimal flap.  Concerned that this is the etiology of her infarcts.  Patient started on aspirin and Plavix on yesterday.  Case discussed with the stroke team at [facility].  It was decided the patient did not  need to be Transferred unless her for the degree of deterioration of neurological exam.  At that time she will be started on heparin as well.  It was felt though that the vascular should be involved in the care of this patient.  Recommendations:  1. Vascular consult to determine timing of intervention 2. Continue Aspirin and Plavix daily  3. At discharge patient to follow-up with vascular neurology ([facility]- Dr. Leonie Man) will see the patient in 2-3 weeks.  Case discussed with Dr. Manuella Ghazi   LOS: 2 days   Alexis Goodell, MD Neurology [phone] 06/13/2017  12:38 PM

## 2017-06-13 NOTE — Anesthesia Preprocedure Evaluation (Addendum)
Anesthesia Evaluation  Patient identified by MRN, date of birth, ID band Patient awake    Reviewed: Allergy & Precautions, H&P , NPO status , Patient's Chart, lab work & pertinent test results, reviewed documented beta blocker date and time   History of Anesthesia Complications Negative for: history of anesthetic complications  Airway Mallampati: III  TM Distance: >3 FB Neck ROM: full    Dental  (+) Caps, Dental Advidsory Given, Teeth Intact   Pulmonary neg shortness of breath, neg sleep apnea, neg COPD, neg recent URI, Current Smoker,           Cardiovascular Exercise Tolerance: Good (-) hypertension(-) angina+ Peripheral Vascular Disease  (-) CAD, (-) Past MI, (-) Cardiac Stents and (-) CABG (-) dysrhythmias (-) Valvular Problems/Murmurs     Neuro/Psych neg Seizures PSYCHIATRIC DISORDERS Anxiety Depression  Neuromuscular disease CVA, No Residual Symptoms negative neurological ROS  negative psych ROS   GI/Hepatic negative GI ROS, Neg liver ROS,   Endo/Other  negative endocrine ROS  Renal/GU negative Renal ROS  negative genitourinary   Musculoskeletal   Abdominal   Peds  Hematology negative hematology ROS (+)   Anesthesia Other Findings Past Medical History: No date: Anxiety No date: Diverticulitis     Comment:  in past No date: Hypercholesteremia   Reproductive/Obstetrics negative OB ROS                            Anesthesia Physical Anesthesia Plan  ASA: III  Anesthesia Plan: General   Post-op Pain Management:    Induction: Intravenous  PONV Risk Score and Plan:   Airway Management Planned: Oral ETT  Additional Equipment: Arterial line  Intra-op Plan:   Post-operative Plan: Extubation in OR  Informed Consent: I have reviewed the patients History and Physical, chart, labs and discussed the procedure including the risks, benefits and alternatives for the proposed  anesthesia with the patient or authorized representative who has indicated his/her understanding and acceptance.   Dental Advisory Given  Plan Discussed with: Anesthesiologist, CRNA and Surgeon  Anesthesia Plan Comments:         Anesthesia Quick Evaluation

## 2017-06-13 NOTE — Evaluation (Signed)
Speech Language Pathology Evaluation Patient Details Name: Jennifer Oconnor MRN: [id number] DOB: January 02, 1961 Today's Date: 06/13/2017 Time: 1330-1430 SLP Time Calculation (min) (ACUTE ONLY): 60 min  Problem List:  Patient Active Problem List   Diagnosis Date Noted  . CVA (cerebral vascular accident) ([place]) 06/11/2017  . Anxiety 11/07/2016  . Depression 11/07/2016  . Cervical radiculopathy 10/24/2016  . Cigarette smoker 11/20/2015  . Bursitis of shoulder 10/18/2015   Past Medical History:  Past Medical History:  Diagnosis Date  . Anxiety   . Diverticulitis    in past  . Hypercholesteremia    Past Surgical History:  Past Surgical History:  Procedure Laterality Date  . ABDOMINAL HYSTERECTOMY  1993  . COLON SURGERY     colectomy 9/11  . COLONOSCOPY N/A 10/15/2014   Procedure: COLONOSCOPY;  Surgeon: Lucilla Lame, MD;  Location: [facility];  Service: Gastroenterology;  Laterality: N/A;  cecum time- 1003  . PILONIDAL CYST DRAINAGE  02/13/12  . POLYPECTOMY  10/15/2014   Procedure: POLYPECTOMY INTESTINAL;  Surgeon: Lucilla Lame, MD;  Location: [facility];  Service: Gastroenterology;;   HPI:  Pt is a 57 y.o. female with a known history of anxiety and hyperlipidemia comes to the emergency room after she started noticing her thought process and memory were not proper.  She was taking a long time to process of part since morning.  She also had some numbness in her right upper extremity.  Husband came home from work and noticed similar findings and brought to the emergency room. She has a history of hypertension she supposed be taking metoprolol but present. She doesn't take it because it makes her feel bad.  MRI revealed multiple areas of acute infarct in the distribution of left MCA, small petechial hemorrhage noted. Likely will need carotid endarterectomy d/t degree of bilateral stenosis per MD. Pt and husband endorsed improvement in symptoms since admission.    Assessment /  Plan / Recommendation Clinical Impression  Pt was given the [employer]asic Screening tool today; husband present in room. Noted pt was labile during discussion w/ NSG just prior. Pt appears to present w/ primarily Expressive Language deficits w/ mild Auditory Comprehension deficits moreso in the area of following more complex sequential commands. Other areas of Auditory Comprehension appeared WFL (object id, object function, Y/N questions, following basic commands). During Expressive Language tasks, pt exhibited decreased elaboration of picture task but was able to give more detail w/ verbal cues. Pt also demonstrated hesitancy in word recall, and inability to recall 2-3 words during screening, but w/ time and intermittent verbal cues(semantic cues), she was able to complete word recall (task). This hesitancy impacted fluency somewhat overall. Responses and insight into her diagnosis were vague - as if she did not want to verbally admit the information(she immediately became labile discussing her diagnosis). Other Expressive tasks of naming and repetition were [facility]; pt conversed at sentence level and followed along w/ conversation w/ NSG, SLP and husband adequately. No Motor Speech deficits noted; No Dysarthria noted. Any Cognitive deficits could have an impact of pt's Cognitive-Linguistic abilities. Pt could benefit from skilled ST services post discharge to address noted impairments and functional deficits with ADL tasks requiring higher level cognitive functioning and language skills/fluency; education on strategies to enhance Cognitive-Linguistic success.     SLP Assessment  SLP Recommendation/Assessment: All further Speech Lanaguage Pathology  needs can be addressed in the next venue of care SLP Visit Diagnosis: Cognitive communication deficit (R41.841);Frontal lobe and executive function deficit Frontal  lobe and executive function deficit following: Cerebral infarction    Follow Up Recommendations   Inpatient Rehab    Frequency and Duration (TBD)  (TBD)      SLP Evaluation Cognition  Overall Cognitive Status: Impaired/Different from baseline Arousal/Alertness: Awake/alert Orientation Level: Oriented X4 Attention: Focused;Sustained;Alternating Focused Attention: Appears intact Sustained Attention: Appears intact Alternating Attention: Impaired Alternating Attention Impairment: Verbal complex;Functional complex Memory: Impaired Memory Impairment: Decreased recall of new information(regarding recent events and new dx) Awareness: Appears intact(increasing lability) Problem Solving: Appears intact Executive Function: Sequencing;Organizing Sequencing: Impaired Sequencing Impairment: Verbal complex;Functional complex Organizing: Impaired Organizing Impairment: Verbal complex;Functional complex Behaviors: Lability Safety/Judgment: Appears intact Comments: would recommend 100% supervision initially post d/c       Comprehension  Auditory Comprehension Overall Auditory Comprehension: Impaired(min difficulty follow more comlex sequential commands) Yes/No Questions: Within Functional Limits Commands: Within Functional Limits Conversation: Complex Other Conversation Comments: pt was able to follow along appropriately through the [place] and during conversation w/ NSG/husband about concern of procedure Interfering Components: Processing speed;Anxiety EffectiveTechniques: Extra processing time;Pausing Visual Recognition/Discrimination Discrimination: Not tested Reading Comprehension Reading Status: Within funtional limits    Expression Expression Primary Mode of Expression: Verbal Verbal Expression Overall Verbal Expression: Impaired Initiation: No impairment Automatic Speech: Name;Social Response;Counting;Day of week;Month of year Level of Generative/Spontaneous Verbalization: Sentence;Conversation(some word recall and vagueness in verbal response tasks) Repetition: No  impairment Naming: No impairment Pragmatics: No impairment Interfering Components: (slower processing and word recall at times) Effective Techniques: Semantic cues(time) Non-Verbal Means of Communication: Not applicable Other Verbal Expression Comments: noted min deficits in fluency and elaboration during verbal tasks   Written Expression Dominant Hand: Right Written Expression: Within Functional Limits   Oral / Motor  Oral Motor/Sensory Function Overall Oral Motor/Sensory Function: Within functional limits Motor Speech Overall Motor Speech: Appears within functional limits for tasks assessed Respiration: Within functional limits Phonation: Normal Resonance: Within functional limits Articulation: Within functional limitis Intelligibility: Intelligible Motor Planning: Witnin functional limits Motor Speech Errors: Not applicable   GO                      Orinda Kenner, MS, CCC-SLP Watson,Katherine 06/13/2017, 5:50 PM

## 2017-06-13 NOTE — Progress Notes (Signed)
Noted PT eval with no PT follow up recommended. Pt Mod I. Pt not in need of an inpatient acute rehab admission at this level. Please call me with any questions. [phone]

## 2017-06-13 NOTE — Progress Notes (Signed)
[facility] [place] NAME: Tashea Othman    MR#:  [id number]  DATE OF BIRTH:  [birth year]  SUBJECTIVE:  CHIEF COMPLAINT:   Chief Complaint  Patient presents with  . Weakness  no complaints, husband at bedside REVIEW OF SYSTEMS:  Review of Systems  Constitutional: Negative for chills, fever and weight loss.  HENT: Negative for nosebleeds and sore throat.   Eyes: Negative for blurred vision.  Respiratory: Negative for cough, shortness of breath and wheezing.   Cardiovascular: Negative for chest pain, orthopnea, leg swelling and PND.  Gastrointestinal: Negative for abdominal pain, constipation, diarrhea, heartburn, nausea and vomiting.  Genitourinary: Negative for dysuria and urgency.  Musculoskeletal: Negative for back pain.  Skin: Negative for rash.  Neurological: Positive for speech change. Negative for dizziness, focal weakness and headaches.  Endo/Heme/Allergies: Does not bruise/bleed easily.  Psychiatric/Behavioral: Negative for depression.   DRUG ALLERGIES:  No Known Allergies VITALS:  Blood pressure (!) 129/56, pulse 80, temperature 97.9 F (36.6 C), temperature source Oral, resp. rate 14, height 5' (1.524 m), weight 71.6 kg (157 lb 12.8 oz), SpO2 97 %. PHYSICAL EXAMINATION:  Physical Exam  Constitutional: She is oriented to person, place, and time and well-developed, well-nourished, and in no distress.  HENT:  Head: Normocephalic and atraumatic.  Eyes: Conjunctivae and EOM are normal. Pupils are equal, round, and reactive to light.  Neck: Normal range of motion. Neck supple. No tracheal deviation present. No thyromegaly present.  Cardiovascular: Normal rate, regular rhythm and normal heart sounds.  Pulmonary/Chest: Effort normal and breath sounds normal. No respiratory distress. She has no wheezes. She exhibits no tenderness.  Abdominal: Soft. Bowel sounds are normal. She exhibits no distension. There is no tenderness.    Musculoskeletal: Normal range of motion.  Neurological: She is alert and oriented to person, place, and time. No cranial nerve deficit.  Skin: Skin is warm and dry. No rash noted.  Psychiatric: Mood and affect normal.   LABORATORY PANEL:  Female CBC Recent Labs  Lab 06/11/17 1812  WBC 9.4  HGB 14.7  HCT 43.3  PLT 300   ------------------------------------------------------------------------------------------------------------------ Chemistries  Recent Labs  Lab 06/11/17 1812  NA 137  K 4.8  CL 105  CO2 23  GLUCOSE 119*  BUN 16  CREATININE 1.24*  CALCIUM 9.7  AST 31  ALT 18  ALKPHOS 90  BILITOT 1.1   RADIOLOGY:  No results found. ASSESSMENT AND PLAN:  Emersen Mascari  is a 57 y.o. female with a known history of anxiety, medication noncompliance and hyperlipidemia admitted for new stroke  1.  Acute CVA left frontal lobe -MRI shows multiple areas of acute infarct in the distribution of left MCA, small petechial hemorrhage noted as well -Per neurology this is likely embolic -Continue aspirin and Plavix for now per neurology -Consult vascular surgery for evaluation of her carotids -Likely will need carotid endarterectomy tomorrow -she is medically clear for planned intervention I have discussed the case with Dr. Delana Meyer from vascular surgery  2.  Hyperlipidemia -Continue statins  3 chronic anxiety continue Xanax and buspirone  4.  Tobacco abuse Smoking cessation advised 4 minutes spent  5.  Elevated blood pressure without diagnosis of hypertension - allow permissive hypertension -PRN hydralazine for systolic greater than 443  Was discussed with patient patient's brother and husband at bedside. Also discussed case with Dr. Doy Mince from neurology       All the records are  reviewed and case discussed with Care Management/Social Worker. Management plans discussed with the patient, family and they are in agreement.  CODE STATUS: Full Code  TOTAL TIME  TAKING CARE OF THIS PATIENT: 35 minutes.   More than 50% of the time was spent in counseling/coordination of care: YES  POSSIBLE D/C IN 1-2 DAYS, DEPENDING ON CLINICAL CONDITION.   Max Sane M.D on 06/13/2017 at 1:03 PM  Between 7am to 6pm - Pager - [phone]  After 6pm go to [email or web address] - Proofreader  [facility]  Office  [phone]  CC: Primary care physician; Albina Billet, MD  Note: This dictation was prepared with Dragon dictation along with smaller phrase technology. Any transcriptional errors that result from this process are unintentional.

## 2017-06-13 NOTE — Care Management Note (Addendum)
Case Management Note  Patient Details  Name: Jennifer Oconnor MRN: [id number] Date of Birth: [birth year]  Subjective/Objective:                   Admitted to [facility] with the diagnosis of CVA. Lives with husband, Jaquelyn Bitter [phone]). Dr. Hall Busing is listed as primary care physician. States she has seen Dr., Hall Busing in the last year. Prescriptions are filled at [facility] on [address]. No home health. No skilled facility. No home oxygen. No medical equipment in the room. Takes care of all basic activities of daily living herself, can't drive now. Fell Christmas Day. Good appetite. Family will transport. NPO. Vascular consult. Possible carotids 06/14/17  Action/Plan: Physical therapy evaluation completed. No follow-up needs recommended.  List of personal care services given to husband.    Expected Discharge Date:                  Expected Discharge Plan:     In-House Referral:   yes  Discharge planning Services     Post Acute Care Choice:    Choice offered to:     DME Arranged:    DME Agency:     HH Arranged:    HH Agency:     Status of Service:     If discussed at [employer], dates discussed:    Additional Comments:  Shelbie Ammons, RN MSN CCM Care Management [phone] 06/13/2017, 11:22 AM

## 2017-06-13 NOTE — Progress Notes (Signed)
Per Vascular, patient can have scheduled lovenox and aspirin. AM dose of plavix held. Madlyn Frankel, RN

## 2017-06-14 ENCOUNTER — Encounter: Payer: Self-pay | Admitting: Anesthesiology

## 2017-06-14 ENCOUNTER — Encounter
Admission: EM | Disposition: A | Discharge status: Home / Self Care | Payer: Self-pay | Source: Home / Self Care | Attending: Internal Medicine

## 2017-06-14 ENCOUNTER — Inpatient Hospital Stay: Payer: Self-pay | Admitting: Anesthesiology

## 2017-06-14 DIAGNOSIS — I6522 Occlusion and stenosis of left carotid artery: Secondary | ICD-10-CM

## 2017-06-14 DIAGNOSIS — I6789 Other cerebrovascular disease: Secondary | ICD-10-CM

## 2017-06-14 DIAGNOSIS — I10 Essential (primary) hypertension: Secondary | ICD-10-CM

## 2017-06-14 HISTORY — PX: ENDARTERECTOMY: SHX5162

## 2017-06-14 LAB — CBC
HEMATOCRIT: 39.5 % (ref 35.0–47.0)
HEMOGLOBIN: 13.2 g/dL (ref 12.0–16.0)
MCH: 31.8 pg (ref 26.0–34.0)
MCHC: 33.4 g/dL (ref 32.0–36.0)
MCV: 95.2 fL (ref 80.0–100.0)
Platelets: 262 10*3/uL (ref 150–440)
RBC: 4.15 MIL/uL (ref 3.80–5.20)
RDW: 13.5 % (ref 11.5–14.5)
WBC: 7.3 10*3/uL (ref 3.6–11.0)

## 2017-06-14 LAB — MRSA PCR SCREENING: MRSA by PCR: NEGATIVE

## 2017-06-14 LAB — BASIC METABOLIC PANEL
ANION GAP: 9 (ref 5–15)
BUN: 19 mg/dL (ref 6–20)
CALCIUM: 8.8 mg/dL — AB (ref 8.9–10.3)
CHLORIDE: 106 mmol/L (ref 101–111)
CO2: 23 mmol/L (ref 22–32)
Creatinine, Ser: 0.93 mg/dL (ref 0.44–1.00)
GFR calc Af Amer: >60 mL/min (ref >60)
GFR calc non Af Amer: >60 mL/min (ref >60)
Glucose, Bld: 102 mg/dL — ABNORMAL HIGH (ref 65–99)
Potassium: 3.8 mmol/L (ref 3.5–5.1)
Sodium: 138 mmol/L (ref 135–145)

## 2017-06-14 LAB — ABO/RH: ABO/RH(D): O POS

## 2017-06-14 LAB — PROTEIN C, TOTAL: Protein C, Total: 128 % (ref 60–150)

## 2017-06-14 SURGERY — ENDARTERECTOMY, CAROTID
Anesthesia: General | Laterality: Left | Wound class: Clean

## 2017-06-14 MED ORDER — ONDANSETRON HCL 4 MG/2ML IJ SOLN
INTRAMUSCULAR | Status: DC | PRN
  Administered 2017-06-14: 4 mg via INTRAVENOUS

## 2017-06-14 MED ORDER — LABETALOL HCL 5 MG/ML IV SOLN: 10.0000 mg | INTRAVENOUS | Status: DC | PRN

## 2017-06-14 MED ORDER — CEFAZOLIN SODIUM 1 G IJ SOLR
INTRAMUSCULAR | Status: AC
  Filled 2017-06-14: qty 10

## 2017-06-14 MED ORDER — ALUM & MAG HYDROXIDE-SIMETH 200-200-20 MG/5ML PO SUSP
15.0000 mL | ORAL | Status: DC | PRN
  Filled 2017-06-14: qty 30

## 2017-06-14 MED ORDER — SODIUM CHLORIDE 0.9 % IJ SOLN
INTRAMUSCULAR | Status: AC
  Filled 2017-06-14: qty 10

## 2017-06-14 MED ORDER — SUGAMMADEX SODIUM 200 MG/2ML IV SOLN
INTRAVENOUS | Status: AC
  Filled 2017-06-14: qty 2

## 2017-06-14 MED ORDER — SODIUM CHLORIDE 0.9 % IV SOLN
INTRAVENOUS | Status: DC
  Administered 2017-06-14 – 2017-06-15 (×2): via INTRAVENOUS

## 2017-06-14 MED ORDER — MIDAZOLAM HCL 2 MG/2ML IJ SOLN
INTRAMUSCULAR | Status: DC | PRN
  Administered 2017-06-14: 2 mg via INTRAVENOUS

## 2017-06-14 MED ORDER — REMIFENTANIL HCL 1 MG IV SOLR
INTRAVENOUS | Status: AC
  Filled 2017-06-14: qty 1000

## 2017-06-14 MED ORDER — ACETAMINOPHEN 650 MG RE SUPP: 325.0000 mg | RECTAL | Status: DC | PRN

## 2017-06-14 MED ORDER — REMIFENTANIL HCL 1 MG IV SOLR
INTRAVENOUS | Status: DC | PRN
  Administered 2017-06-14: .05 ug/kg/min via INTRAVENOUS

## 2017-06-14 MED ORDER — MIDAZOLAM HCL 2 MG/2ML IJ SOLN
INTRAMUSCULAR | Status: AC
  Filled 2017-06-14: qty 2

## 2017-06-14 MED ORDER — PANTOPRAZOLE SODIUM 40 MG IV SOLR
40.0000 mg | Freq: Every day | INTRAVENOUS | Status: DC
  Administered 2017-06-14 – 2017-06-15 (×2): 40 mg via INTRAVENOUS
  Filled 2017-06-14 (×2): qty 40

## 2017-06-14 MED ORDER — SODIUM CHLORIDE 0.9 % IV SOLN
INTRAVENOUS | Status: DC | PRN
  Administered 2017-06-14: 150 mL via INTRAMUSCULAR

## 2017-06-14 MED ORDER — EPHEDRINE SULFATE 50 MG/ML IJ SOLN
INTRAMUSCULAR | Status: AC
  Filled 2017-06-14: qty 1

## 2017-06-14 MED ORDER — SODIUM CHLORIDE 0.9 % IV SOLN
INTRAVENOUS | Status: DC | PRN
  Administered 2017-06-14: 08:00:00 via INTRAVENOUS

## 2017-06-14 MED ORDER — EPHEDRINE SULFATE 50 MG/ML IJ SOLN
INTRAMUSCULAR | Status: DC | PRN
  Administered 2017-06-14: 10 mg via INTRAVENOUS

## 2017-06-14 MED ORDER — LACTATED RINGERS IV SOLN
INTRAVENOUS | Status: DC | PRN
  Administered 2017-06-14: 08:00:00 via INTRAVENOUS

## 2017-06-14 MED ORDER — ONDANSETRON HCL 4 MG/2ML IJ SOLN
4.0000 mg | Freq: Four times a day (QID) | INTRAMUSCULAR | Status: DC | PRN
  Administered 2017-06-14: 4 mg via INTRAVENOUS
  Filled 2017-06-14: qty 2

## 2017-06-14 MED ORDER — DOCUSATE SODIUM 100 MG PO CAPS
100.0000 mg | ORAL_CAPSULE | Freq: Every day | ORAL | Status: DC
  Administered 2017-06-15 – 2017-06-16 (×2): 100 mg via ORAL
  Filled 2017-06-14 (×2): qty 1

## 2017-06-14 MED ORDER — HEPARIN SODIUM (PORCINE) 1000 UNIT/ML IJ SOLN
INTRAMUSCULAR | Status: AC
  Filled 2017-06-14: qty 1

## 2017-06-14 MED ORDER — BUPIVACAINE-EPINEPHRINE 0.5% -1:200000 IJ SOLN
INTRAMUSCULAR | Status: DC | PRN
  Administered 2017-06-14: 6 mL

## 2017-06-14 MED ORDER — SUGAMMADEX SODIUM 200 MG/2ML IV SOLN
INTRAVENOUS | Status: DC | PRN
  Administered 2017-06-14: 150 mg via INTRAVENOUS

## 2017-06-14 MED ORDER — DEXAMETHASONE SODIUM PHOSPHATE 10 MG/ML IJ SOLN
INTRAMUSCULAR | Status: AC
  Filled 2017-06-14: qty 1

## 2017-06-14 MED ORDER — OXYCODONE HCL 5 MG PO TABS: 5.0000 mg | ORAL_TABLET | ORAL | Status: DC | PRN

## 2017-06-14 MED ORDER — HEPARIN SODIUM (PORCINE) 1000 UNIT/ML IJ SOLN
INTRAMUSCULAR | Status: DC | PRN
  Administered 2017-06-14: 7000 [IU] via INTRAVENOUS

## 2017-06-14 MED ORDER — CEFAZOLIN SODIUM-DEXTROSE 1-4 GM/50ML-% IV SOLN
1.0000 g | Freq: Three times a day (TID) | INTRAVENOUS | Status: DC
  Filled 2017-06-14 (×2): qty 50

## 2017-06-14 MED ORDER — GLYCOPYRROLATE 0.2 MG/ML IJ SOLN
INTRAMUSCULAR | Status: AC
  Filled 2017-06-14: qty 1

## 2017-06-14 MED ORDER — PROMETHAZINE HCL 25 MG/ML IJ SOLN: 6.2500 mg | INTRAMUSCULAR | Status: DC | PRN

## 2017-06-14 MED ORDER — DEXTROSE 5 % IV SOLN
1.0000 g | Freq: Three times a day (TID) | INTRAVENOUS | Status: AC
  Administered 2017-06-14 – 2017-06-15 (×3): 1 g via INTRAVENOUS
  Filled 2017-06-14 (×3): qty 10

## 2017-06-14 MED ORDER — GLYCOPYRROLATE 0.2 MG/ML IJ SOLN
INTRAMUSCULAR | Status: DC | PRN
  Administered 2017-06-14: 0.2 mg via INTRAVENOUS

## 2017-06-14 MED ORDER — ROCURONIUM BROMIDE 100 MG/10ML IV SOLN
INTRAVENOUS | Status: DC | PRN
  Administered 2017-06-14: 20 mg via INTRAVENOUS
  Administered 2017-06-14: 30 mg via INTRAVENOUS
  Administered 2017-06-14: 20 mg via INTRAVENOUS

## 2017-06-14 MED ORDER — BUPIVACAINE-EPINEPHRINE (PF) 0.5% -1:200000 IJ SOLN
INTRAMUSCULAR | Status: AC
  Filled 2017-06-14: qty 30

## 2017-06-14 MED ORDER — HYDRALAZINE HCL 20 MG/ML IJ SOLN: 5.0000 mg | INTRAMUSCULAR | Status: DC | PRN

## 2017-06-14 MED ORDER — ROCURONIUM BROMIDE 50 MG/5ML IV SOLN
INTRAVENOUS | Status: AC
  Filled 2017-06-14: qty 1

## 2017-06-14 MED ORDER — FENTANYL CITRATE (PF) 100 MCG/2ML IJ SOLN
INTRAMUSCULAR | Status: AC
  Administered 2017-06-14: 50 ug via INTRAVENOUS
  Filled 2017-06-14: qty 2

## 2017-06-14 MED ORDER — PHENYLEPHRINE HCL 10 MG/ML IJ SOLN
INTRAMUSCULAR | Status: DC | PRN
  Administered 2017-06-14: 50 ug via INTRAVENOUS
  Administered 2017-06-14 (×2): 100 ug via INTRAVENOUS
  Administered 2017-06-14 (×2): 50 ug via INTRAVENOUS
  Administered 2017-06-14 (×2): 100 ug via INTRAVENOUS

## 2017-06-14 MED ORDER — MORPHINE SULFATE (PF) 2 MG/ML IV SOLN
2.0000 mg | INTRAVENOUS | Status: DC | PRN
  Administered 2017-06-14 – 2017-06-15 (×4): 2 mg via INTRAVENOUS
  Filled 2017-06-14 (×5): qty 1

## 2017-06-14 MED ORDER — STERILE WATER FOR INJECTION IJ SOLN
INTRAMUSCULAR | Status: AC
  Filled 2017-06-14: qty 10

## 2017-06-14 MED ORDER — FENTANYL CITRATE (PF) 100 MCG/2ML IJ SOLN
25.0000 ug | INTRAMUSCULAR | Status: DC | PRN
  Administered 2017-06-14: 50 ug via INTRAVENOUS
  Administered 2017-06-14 (×2): 25 ug via INTRAVENOUS
  Administered 2017-06-14: 50 ug via INTRAVENOUS
  Administered 2017-06-14: 25 ug via INTRAVENOUS

## 2017-06-14 MED ORDER — REMIFENTANIL HCL 1 MG IV SOLR
INTRAVENOUS | Status: DC | PRN
  Administered 2017-06-14 (×2): 50 ug via INTRAVENOUS
  Administered 2017-06-14: 100 ug via INTRAVENOUS

## 2017-06-14 MED ORDER — FENTANYL CITRATE (PF) 100 MCG/2ML IJ SOLN
INTRAMUSCULAR | Status: AC
  Administered 2017-06-14: 25 ug via INTRAVENOUS
  Filled 2017-06-14: qty 2

## 2017-06-14 MED ORDER — EVICEL 2 ML EX KIT
PACK | CUTANEOUS | Status: DC | PRN
  Administered 2017-06-14: 2 mL via TOPICAL

## 2017-06-14 MED ORDER — PROPOFOL 10 MG/ML IV BOLUS
INTRAVENOUS | Status: DC | PRN
  Administered 2017-06-14: 120 mg via INTRAVENOUS

## 2017-06-14 MED ORDER — DEXAMETHASONE SODIUM PHOSPHATE 10 MG/ML IJ SOLN
INTRAMUSCULAR | Status: DC | PRN
  Administered 2017-06-14: 10 mg via INTRAVENOUS

## 2017-06-14 MED ORDER — ONDANSETRON HCL 4 MG/2ML IJ SOLN
INTRAMUSCULAR | Status: AC
  Filled 2017-06-14: qty 2

## 2017-06-14 MED ORDER — SODIUM CHLORIDE 0.9 % IV SOLN: 500.0000 mL | Freq: Once | INTRAVENOUS | Status: DC | PRN

## 2017-06-14 MED ORDER — METOPROLOL TARTRATE 5 MG/5ML IV SOLN: 2.0000 mg | INTRAVENOUS | Status: DC | PRN

## 2017-06-14 MED ORDER — ACETAMINOPHEN 325 MG PO TABS
325.0000 mg | ORAL_TABLET | ORAL | Status: DC | PRN
  Administered 2017-06-15 – 2017-06-16 (×3): 650 mg via ORAL
  Filled 2017-06-14 (×3): qty 2

## 2017-06-14 MED ORDER — PROPOFOL 10 MG/ML IV BOLUS
INTRAVENOUS | Status: AC
  Filled 2017-06-14: qty 20

## 2017-06-14 SURGICAL SUPPLY — 64 items
APPLIER CLIP 11 MED OPEN (CLIP)
APPLIER CLIP 9.375 SM OPEN (CLIP)
BAG DECANTER FOR FLEXI CONT (MISCELLANEOUS) ×2 IMPLANT
BLADE SURG 15 STRL LF DISP TIS (BLADE) ×1 IMPLANT
BLADE SURG 15 STRL SS (BLADE) ×1
BLADE SURG SZ11 CARB STEEL (BLADE) ×2 IMPLANT
BOOT SUTURE AID YELLOW STND (SUTURE) ×4 IMPLANT
BRUSH SCRUB EZ  4% CHG (MISCELLANEOUS) ×1
BRUSH SCRUB EZ 4% CHG (MISCELLANEOUS) ×1 IMPLANT
CANISTER SUCT 1200ML W/VALVE (MISCELLANEOUS) ×2 IMPLANT
CLIP APPLIE 11 MED OPEN (CLIP) IMPLANT
CLIP APPLIE 9.375 SM OPEN (CLIP) IMPLANT
DERMABOND ADVANCED (GAUZE/BANDAGES/DRESSINGS) ×1
DERMABOND ADVANCED .7 DNX12 (GAUZE/BANDAGES/DRESSINGS) ×1 IMPLANT
DRAPE INCISE IOBAN 66X45 STRL (DRAPES) ×2 IMPLANT
DRAPE LAPAROTOMY 100X77 ABD (DRAPES) ×2 IMPLANT
DRAPE SHEET LG 3/4 BI-LAMINATE (DRAPES) ×2 IMPLANT
DRESSING SURGICEL FIBRLLR 1X2 (HEMOSTASIS) ×1 IMPLANT
DRSG SURGICEL FIBRILLAR 1X2 (HEMOSTASIS) ×2
DURAPREP 26ML APPLICATOR (WOUND CARE) ×2 IMPLANT
ELECT CAUTERY BLADE 6.4 (BLADE) ×2 IMPLANT
ELECT REM PT RETURN 9FT ADLT (ELECTROSURGICAL) ×2
ELECTRODE REM PT RTRN 9FT ADLT (ELECTROSURGICAL) ×1 IMPLANT
GLOVE BIO SURGEON STRL SZ7 (GLOVE) ×2 IMPLANT
GLOVE INDICATOR 7.5 STRL GRN (GLOVE) ×2 IMPLANT
GLOVE SURG SYN 8.0 (GLOVE) ×2 IMPLANT
GOWN STRL REUS W/ TWL LRG LVL3 (GOWN DISPOSABLE) ×2 IMPLANT
GOWN STRL REUS W/ TWL XL LVL3 (GOWN DISPOSABLE) ×1 IMPLANT
GOWN STRL REUS W/TWL LRG LVL3 (GOWN DISPOSABLE) ×2
GOWN STRL REUS W/TWL XL LVL3 (GOWN DISPOSABLE) ×1
HOLDER FOLEY CATH W/STRAP (MISCELLANEOUS) ×2 IMPLANT
IV NS 500ML (IV SOLUTION) ×1
IV NS 500ML BAXH (IV SOLUTION) ×1 IMPLANT
KIT RM TURNOVER STRD PROC AR (KITS) ×2 IMPLANT
LABEL OR SOLS (LABEL) ×2 IMPLANT
LOOP RED MAXI  1X406MM (MISCELLANEOUS) ×2
LOOP VESSEL MAXI 1X406 RED (MISCELLANEOUS) ×2 IMPLANT
LOOP VESSEL MINI 0.8X406 BLUE (MISCELLANEOUS) ×1 IMPLANT
LOOPS BLUE MINI 0.8X406MM (MISCELLANEOUS) ×1
NEEDLE FILTER BLUNT 18X 1/2SAF (NEEDLE) ×1
NEEDLE FILTER BLUNT 18X1 1/2 (NEEDLE) ×1 IMPLANT
NEEDLE HYPO 25X1 1.5 SAFETY (NEEDLE) ×2 IMPLANT
NS IRRIG 500ML POUR BTL (IV SOLUTION) ×2 IMPLANT
PACK BASIN MAJOR ARMC (MISCELLANEOUS) ×2 IMPLANT
PATCH CAROTID ECM VASC 1X10 (Prosthesis & Implant Heart) ×2 IMPLANT
PENCIL ELECTRO HAND CTR (MISCELLANEOUS) IMPLANT
SHUNT CAROTID STR REINF 3.0X4. (MISCELLANEOUS) ×2 IMPLANT
SUT MNCRL+ 5-0 UNDYED PC-3 (SUTURE) ×1 IMPLANT
SUT MONOCRYL 5-0 (SUTURE) ×1
SUT PROLENE 6 0 BV (SUTURE) ×16 IMPLANT
SUT PROLENE 7 0 BV 1 (SUTURE) ×8 IMPLANT
SUT SILK 2 0 (SUTURE) ×1
SUT SILK 2-0 18XBRD TIE 12 (SUTURE) ×1 IMPLANT
SUT SILK 3 0 (SUTURE) ×1
SUT SILK 3-0 18XBRD TIE 12 (SUTURE) ×1 IMPLANT
SUT SILK 4 0 (SUTURE) ×1
SUT SILK 4-0 18XBRD TIE 12 (SUTURE) ×1 IMPLANT
SUT VIC AB 3-0 SH 27 (SUTURE) ×1
SUT VIC AB 3-0 SH 27X BRD (SUTURE) ×1 IMPLANT
SYR 10ML LL (SYRINGE) ×2 IMPLANT
SYR 20CC LL (SYRINGE) ×2 IMPLANT
SYR 3ML LL SCALE MARK (SYRINGE) ×2 IMPLANT
TRAY FOLEY W/METER SILVER 16FR (SET/KITS/TRAYS/PACK) ×2 IMPLANT
TUBING CONNECTING 10 (TUBING) IMPLANT

## 2017-06-14 NOTE — Progress Notes (Signed)
OT Cancellation Note  Patient Details Name: Jennifer Oconnor MRN: [id number] DOB: 11/28/1960   Cancelled Treatment:    Reason Eval/Treat Not Completed: Medical issues which prohibited therapy. Chart reviewed. Pt had left carotid endarterectomy performed this date. Due to change in pt status, per therapy protocol, will need new orders for skilled OT services. Will sign off. Please re-consult as appropriate.   [name], MPH, MS, OTR/L ascom [phone] 06/14/17, 3:18 PM

## 2017-06-14 NOTE — Transfer of Care (Signed)
Immediate Anesthesia Transfer of Care Note  Patient: Jennifer Oconnor  Procedure(s) Performed: ENDARTERECTOMY CAROTID (Left )  Patient Location: PACU  Anesthesia Type:General  Level of Consciousness: awake, alert  and oriented  Airway & Oxygen Therapy: Patient Spontanous Breathing and Patient connected to face mask oxygen  Post-op Assessment: Report given to RN and Post -op Vital signs reviewed and stable  Post vital signs: Reviewed and stable  Last Vitals:  Vitals:   06/14/17 0427 06/14/17 1038  BP: 138/61 (!) 150/81  Pulse: 82 (!) 111  Resp: 20 14  Temp: 36.4 C (!) 36.3 C  SpO2: 100% 100%    Last Pain:  Vitals:   06/14/17 0427  TempSrc: Oral  PainSc:          Complications: No apparent anesthesia complications

## 2017-06-14 NOTE — Progress Notes (Signed)
Patient has been A&Ox4.  Pain controlled with IV morphine.  Blood pressure stable and NSR per cardiac monitor.  No neurological deficits noted and incision to left neck is unchanged from last assessment. Husband at bedside. Report given to Cyra, RN who is now taking over patient's care.

## 2017-06-14 NOTE — Consult Note (Signed)
[facility] Consult Note  MRN : [id number]  Jennifer Oconnor is a 57 y.o. (03/31/1961) female who presents with chief complaint of  Chief Complaint  Patient presents with  . Weakness  .  History of Present Illness: I am asked to see the patient by Dr. Manuella Ghazi.  The patient is a 57 year old woman who presented to the office with weakness of her right arm.  Approximately 3 days ago she noticed she was having difficulty with speaking.  She described it as she knew what she wanted to say but was unable to say it.  She also noted numbness and subsequently weakness of her right arm.  Her husband brought her to the emergency room where a stat CT scan of the head demonstrated probable left frontal stroke.  Subsequent workup included duplex ultrasound as well as a CT angiogram of the neck.  CT angiogram of the neck demonstrates a 60-70% stenosis associated with what appears to be thrombus within the internal carotid.  Since admission her symptoms have improved tremendously.  She has not experienced any recurrence or further deterioration.  Past medical history is benign she does have hypercholesterolemia as well as anxiety.  No history of malignant hypertension.  Current Facility-Administered Medications  Medication Dose Route Frequency Provider Last Rate Last Dose  . [MAR Hold] acetaminophen (TYLENOL) tablet 650 mg  650 mg Oral Q4H PRN Fritzi Mandes, MD       Or  . Doug Sou Hold] acetaminophen (TYLENOL) solution 650 mg  650 mg Per Tube Q4H PRN Fritzi Mandes, MD       Or  . Doug Sou Hold] acetaminophen (TYLENOL) suppository 650 mg  650 mg Rectal Q4H PRN Fritzi Mandes, MD      . Doug Sou Hold] ALPRAZolam [name]) tablet 0.5 mg  0.5 mg Oral BID PRN Fritzi Mandes, MD   0.5 mg at 06/14/17 0425  . [MAR Hold] aspirin EC tablet 81 mg  81 mg Oral Daily Alexis Goodell, MD   81 mg at 06/13/17 1058  . [MAR Hold] atorvastatin (LIPITOR) tablet 80 mg  80 mg Oral q1800 Max Sane, MD   80 mg at  06/13/17 2151  . [MAR Hold] buPROPion (WELLBUTRIN XL) 24 hr tablet 300 mg  300 mg Oral Daily Fritzi Mandes, MD   300 mg at 06/13/17 1058  . [MAR Hold] ceFAZolin (ANCEF) IVPB 1 g/50 mL premix  1 g Intravenous On Call Makeisha Jentsch, Dolores Lory, MD      . Doug Sou Hold] clopidogrel (PLAVIX) tablet 75 mg  75 mg Oral Daily Alexis Goodell, MD   Stopped at 06/14/17 1000  . [MAR Hold] enoxaparin (LOVENOX) injection 40 mg  40 mg Subcutaneous Q24H Fritzi Mandes, MD   40 mg at 06/13/17 2146  . [MAR Hold] hydrALAZINE (APRESOLINE) injection 10 mg  10 mg Intravenous Q6H PRN Fritzi Mandes, MD        Past Medical History:  Diagnosis Date  . Anxiety   . Diverticulitis    in past  . Hypercholesteremia     Past Surgical History:  Procedure Laterality Date  . ABDOMINAL HYSTERECTOMY  1993  . COLON SURGERY     colectomy 9/11  . COLONOSCOPY N/A 10/15/2014   Procedure: COLONOSCOPY;  Surgeon: Lucilla Lame, MD;  Location: [facility];  Service: Gastroenterology;  Laterality: N/A;  cecum time- 1003  . PILONIDAL CYST DRAINAGE  02/13/12  . POLYPECTOMY  10/15/2014   Procedure: POLYPECTOMY INTESTINAL;  Surgeon: Lucilla Lame, MD;  Location: [facility];  Service: Gastroenterology;;    Social History Social History   Tobacco Use  . Smoking status: Current Every Day Smoker    Packs/day: 0.50    Years: 40.00    Pack years: 20.00  . Smokeless tobacco: Never Used  Substance Use Topics  . Alcohol use: No  . Drug use: No    Family History History reviewed. No pertinent family history. No family history of bleeding/clotting disorders, porphyria or autoimmune disease   No Known Allergies   REVIEW OF SYSTEMS (Negative unless checked)  Constitutional: [] Weight loss  [] Fever  [] Chills Cardiac: [] Chest pain   [] Chest pressure   [] Palpitations   [] Shortness of breath when laying flat   [] Shortness of breath at rest   [] Shortness of breath with exertion. Vascular:  [] Pain in legs with walking   [] Pain in legs at  rest   [] Pain in legs when laying flat   [] Claudication   [] Pain in feet when walking  [] Pain in feet at rest  [] Pain in feet when laying flat   [] History of DVT   [] Phlebitis   [] Swelling in legs   [] Varicose veins   [] Non-healing ulcers Pulmonary:   [] Uses home oxygen   [] Productive cough   [] Hemoptysis   [] Wheeze  [] COPD   [] Asthma Neurologic:  [] Dizziness  [] Blackouts   [] Seizures   [x] History of stroke   [] History of TIA  [x] Aphasia   [] Temporary blindness   [] Dysphagia   [x] Weakness or numbness in arms   [] Weakness or numbness in legs Musculoskeletal:  [] Arthritis   [] Joint swelling   [] Joint pain   [] Low back pain Hematologic:  [] Easy bruising  [] Easy bleeding   [] Hypercoagulable state   [] Anemic  [] Hepatitis Gastrointestinal:  [] Blood in stool   [] Vomiting blood  [] Gastroesophageal reflux/heartburn   [] Difficulty swallowing. Genitourinary:  [] Chronic kidney disease   [] Difficult urination  [] Frequent urination  [] Burning with urination   [] Blood in urine Skin:  [] Rashes   [] Ulcers   [] Wounds Psychological:  [] History of anxiety   []  History of major depression.  Physical Examination  Vitals:   06/13/17 1327 06/13/17 2036 06/14/17 0024 06/14/17 0427  BP: (!) 154/74 (!) 161/64 (!) 164/74 138/61  Pulse: (!) 103 98 97 82  Resp: 20 (!) 24 18 20   Temp: 98.5 F (36.9 C) 97.9 F (36.6 C) 97.6 F (36.4 C) 97.6 F (36.4 C)  TempSrc: Oral Oral Oral Oral  SpO2: 98% 99% 100% 100%  Weight:      Height:       Body mass index is 30.82 kg/m. Gen:  WD/WN, NAD Head: [place]/AT, No temporalis wasting. Prominent temp pulse not noted. Ear/Nose/Throat: Hearing grossly intact, nares w/o erythema or drainage, oropharynx w/o Erythema/Exudate Eyes: Sclera non-icteric, conjunctiva clear Neck: Trachea midline.  No JVD.  Pulmonary:  Good air movement, respirations not labored, equal bilaterally.  Cardiac: RRR, normal S1, S2. Vascular: Left carotid bruit v Vessel Right Left  Radial Palpable Palpable   Ulnar Palpable Palpable  Brachial Palpable Palpable  Carotid Palpable, without bruit Palpable, without bruit  Gastrointestinal: soft, non-tender/non-distended. No guarding/reflex.  Musculoskeletal: M/S 5/5 throughout legs and left arm right arm demonstrates 4/5 motor strength normal sensory.  Extremities without ischemic changes.  No deformity or atrophy. No edema. Neurologic: Sensation grossly intact in extremities but the right.  Symmetrical.  Speech is fluent at the time of my interview. Motor exam as listed above. Psychiatric: Judgment intact, Mood & affect appropriate for pt's clinical situation. Dermatologic: No rashes or ulcers  noted.  No cellulitis or open wounds. Lymph : No Cervical, Axillary, or Inguinal lymphadenopathy.      CBC Lab Results  Component Value Date   WBC 7.3 06/14/2017   HGB 13.2 06/14/2017   HCT 39.5 06/14/2017   MCV 95.2 06/14/2017   PLT 262 06/14/2017    BMET    Component Value Date/Time   NA 138 06/14/2017 0506   K 3.8 06/14/2017 0506   CL 106 06/14/2017 0506   CO2 23 06/14/2017 0506   GLUCOSE 102 (H) 06/14/2017 0506   BUN 19 06/14/2017 0506   CREATININE 0.93 06/14/2017 0506   CALCIUM 8.8 (L) 06/14/2017 0506   GFRNONAA >60 06/14/2017 0506   GFRAA >60 06/14/2017 0506   Estimated Creatinine Clearance: 58.9 mL/min (by C-G formula based on SCr of 0.93 mg/dL).  COAG No results found for: INR, PROTIME  Radiology Ct Head Wo Contrast  Result Date: 06/11/2017 CLINICAL DATA:  Acute presentation with right-sided weakness beginning this morning. EXAM: CT HEAD WITHOUT CONTRAST TECHNIQUE: Contiguous axial images were obtained from the base of the skull through the vertex without intravenous contrast. COMPARISON:  05/28/2017 FINDINGS: Brain: Question region of cortical and subcortical low-density developing in the left frontal lobe axial images 15 through 18. No sign of swelling or hemorrhage. The remainder the brain appears normal. No hydrocephalus. No  extra-axial collection. Vascular: No abnormal vascular finding. Skull: Normal Sinuses/Orbits: Clear/normal Other: None IMPRESSION: Question area of developing low-density in the left frontal lobe which could represent changes of acute infarction in a left MCA branch vessel. No swelling or hemorrhage. I do not think the findings are absolutely conclusive but they are suspicious. Electronically Signed   By: Nelson Chimes M.D.   On: 06/11/2017 18:51   Ct Head Wo Contrast  Result Date: 05/28/2017 CLINICAL DATA:  Fall, hit head. EXAM: CT HEAD WITHOUT CONTRAST TECHNIQUE: Contiguous axial images were obtained from the base of the skull through the vertex without intravenous contrast. COMPARISON:  None. FINDINGS: Brain: No acute intracranial abnormality. Specifically, no hemorrhage, hydrocephalus, mass lesion, acute infarction, or significant intracranial injury. Vascular: No hyperdense vessel or unexpected calcification. Skull: No acute calvarial abnormality. Sinuses/Orbits: Visualized paranasal sinuses and mastoids clear. Orbital soft tissues unremarkable. Other: None IMPRESSION: No acute intracranial abnormality. Electronically Signed   By: Rolm Baptise M.D.   On: 05/28/2017 09:01   Ct Angio Neck W Or Wo Contrast  Result Date: 06/12/2017 CLINICAL DATA:  Stroke EXAM: CT ANGIOGRAPHY NECK TECHNIQUE: Multidetector CT imaging of the neck was performed using the standard protocol during bolus administration of intravenous contrast. Multiplanar CT image reconstructions and MIPs were obtained to evaluate the vascular anatomy. Carotid stenosis measurements (when applicable) are obtained utilizing NASCET criteria, using the distal internal carotid diameter as the denominator. CONTRAST:  65mL ISOVUE-370 IOPAMIDOL (ISOVUE-370) INJECTION 76% COMPARISON:  MRI head 06/11/2017 FINDINGS: Aortic arch: Mild atherosclerotic disease aortic arch and proximal great vessels. Mild stenosis origin left subclavian artery. Right carotid  system: Right common carotid artery widely patent. Atherosclerotic plaque in the right carotid bulb which is noncalcified. Minimal luminal diameter 2.7 mm corresponding to 20% diameter stenosis. No filling defect Left carotid system: Left common carotid artery patent without significant stenosis. Noncalcified plaque left carotid bulb. Minimal luminal diameter 2.1 mm corresponding to 35% diameter stenosis. Just above the stenosis, there is a small linear filling defect within the lumen compatible with thrombus or intimal flap. This may be a source of emboli from the left MCA stroke. Vertebral arteries: Both  vertebral arteries widely patent Skeleton: Spondylosis with prominent spurring C5-6 causing spinal stenosis. No acute skeletal abnormality. 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 Small linear filling defect in the internal carotid artery most likely thrombus or intimal flap and is a source of emboli causing left MCA stroke. Both vertebral arteries widely patent. These results will be called to the ordering clinician or representative by the Radiologist Assistant, and communication documented in the PACS or zVision Dashboard. Electronically Signed   By: Franchot Gallo M.D.   On: 06/12/2017 12:02   Mr Brain Wo Contrast  Result Date: 06/11/2017 CLINICAL DATA:  Initial evaluation for right-sided numbness, confusion. EXAM: MRI HEAD WITHOUT CONTRAST MRA HEAD WITHOUT CONTRAST TECHNIQUE: Multiplanar, multiecho pulse sequences of the brain and surrounding structures were obtained without intravenous contrast. Angiographic images of the head were obtained using MRA technique without contrast. COMPARISON:  Prior CT from earlier the same day. FINDINGS: MRI HEAD FINDINGS Brain: Cerebral volume normal for age. Patchy T2/FLAIR hyperintensity within the periventricular and deep white matter  both cerebral hemispheres, nonspecific, but most likely related chronic microvascular ischemic disease. Chronic microvascular changes present within the pons. Patchy multifocal acute ischemic cortical and subcortical infarcts present within the left frontal and parietal lobes, left MCA distribution (series 100, image 38). Faint petechial hemorrhage posteriorly without hemorrhagic transformation. No associated mass effect. No other evidence for acute or subacute ischemia. No mass lesion, midline shift or mass effect. No hydrocephalus. No extra-axial fluid collection. Major dural sinuses are grossly patent. Pituitary gland suprasellar region normal. Midline structures intact and normal. Vascular: Major intracranial vascular flow voids are maintained. Skull and upper cervical spine: Craniocervical junction normal. Upper cervical spine within normal limits. Bone marrow signal intensity normal. No scalp soft tissue abnormality. Sinuses/Orbits: Globes and orbital soft tissues normal. Paranasal sinuses clear. No mastoid effusion. Inner ear structures normal. Other: None. MRA HEAD FINDINGS ANTERIOR CIRCULATION: Study mildly degraded by motion. Distal cervical segments of the internal carotid arteries are patent with antegrade flow. Petrous, cavernous, and supraclinoid segments widely patent bilaterally. ICA termini patent. A1 segments patent. Normal anterior communicating artery. Anterior cerebral arteries widely patent to their distal aspects. M1 segments patent without stenosis or occlusion. Normal MCA bifurcations. Distal MCA branches well perfused and symmetric. POSTERIOR CIRCULATION: Vertebral arteries patent to the vertebrobasilar junction without stenosis. Posterior inferior cerebral arteries patent bilaterally. Basilar artery widely patent. Superior cerebral arteries patent bilaterally. Both of the posterior cerebral arteries primarily supplied via the basilar and are well perfused to their distal aspects. No  aneurysm. IMPRESSION: MRI HEAD IMPRESSION:: 1. 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 Minimal faint petechial hemorrhage without hemorrhagic transformation or mass effect. 2. Underlying chronic small vessel ischemic changes, mild to moderate for age. MRA HEAD IMPRESSION: Negative intracranial MRA. No large vessel occlusion. No hemodynamically significant or correctable stenosis identified. Electronically Signed   By: Jeannine Boga M.D.   On: 06/11/2017 22:54   US Carotid Bilateral (at [facility] Only)  Result Date: 06/12/2017 CLINICAL DATA:  Stroke.  Memory loss. EXAM: BILATERAL CAROTID DUPLEX ULTRASOUND TECHNIQUE: Pearline Cables scale imaging, color Doppler and duplex ultrasound was performed of bilateral carotid and vertebral arteries in the neck. COMPARISON:  03/26/2002 by report only TECHNIQUE: Quantification of carotid stenosis is based on velocity parameters that correlate the residual internal carotid diameter with  NASCET-based stenosis levels, using the diameter of the distal internal carotid lumen as the denominator for stenosis measurement. The following velocity measurements were obtained: PEAK SYSTOLIC/END DIASTOLIC RIGHT ICA:                     178/52cm/sec CCA:                     84/69GE/XBM SYSTOLIC ICA/CCA RATIO:  1.9 DIASTOLIC ICA/CCA RATIO: 2.3 ECA:                     160cm/sec LEFT ICA:                     253/41cm/sec CCA:                     841/32GM/WNU SYSTOLIC ICA/CCA RATIO:  2.4 DIASTOLIC ICA/CCA RATIO: 1.8 ECA:                     213cm/sec FINDINGS: RIGHT CAROTID ARTERY: Eccentric noncalcified plaque in the distal common carotid artery and bulb extending into the proximal ICA. No high-grade stenosis. Normal waveforms and color Doppler signal. RIGHT VERTEBRAL ARTERY:  Normal flow direction and waveform. LEFT CAROTID ARTERY: Eccentric noncalcified plaque in the carotid  bulb extending into proximal internal and external carotid arteries. Focal stenosis in the proximal ICA with elevated peak systolic velocities just distally. Normal color Doppler signal and waveforms otherwise. LEFT VERTEBRAL ARTERY: Normal flow direction and waveform. IMPRESSION: 1. Bilateral carotid bifurcation and proximal ICA plaque, resulting in less than 50% diameter stenosis on the right, 50-69% diameter proximal ICA stenosis on the left. This represents progression of disease on the left since previous examination. 2.  Antegrade bilateral vertebral arterial flow. Electronically Signed   By: Lucrezia Europe M.D.   On: 06/12/2017 10:03   Dg Chest Portable 1 View  Result Date: 06/11/2017 CLINICAL DATA:  Right side weakness since waking up this morning. EXAM: PORTABLE CHEST 1 VIEW COMPARISON:  PA and lateral chest 03/22/2015 and 04/14/2014. FINDINGS: Lungs are clear. Heart size is normal. No pneumothorax or pleural fluid. No bony abnormality. IMPRESSION: Negative chest. Electronically Signed   By: Inge Rise M.D.   On: 06/11/2017 18:56   Dg Hand Complete Left  Result Date: 05/28/2017 CLINICAL DATA:  Fall injuring left hand. EXAM: LEFT HAND - COMPLETE 3+ VIEW COMPARISON:  None. FINDINGS: There is no evidence of fracture or dislocation. There is no evidence of arthropathy or other focal bone abnormality. Soft tissues are unremarkable. IMPRESSION: Negative. Electronically Signed   By: Marin Olp M.D.   On: 05/28/2017 08:53   Mr Jodene Nam Head/brain UV Cm  Result Date: 06/11/2017 CLINICAL DATA:  Initial evaluation for right-sided numbness, confusion. EXAM: MRI HEAD WITHOUT CONTRAST MRA HEAD WITHOUT CONTRAST TECHNIQUE: Multiplanar, multiecho pulse sequences of the brain and surrounding structures were obtained without intravenous contrast. Angiographic images of the head were obtained using MRA technique without contrast. COMPARISON:  Prior CT from earlier the same day. FINDINGS: MRI HEAD FINDINGS Brain:  Cerebral volume normal for age. Patchy T2/FLAIR hyperintensity within the periventricular and deep white matter both cerebral hemispheres, nonspecific, but most likely related chronic microvascular ischemic disease. Chronic microvascular changes present within the pons. Patchy multifocal acute ischemic cortical and subcortical infarcts present within the left frontal and parietal lobes, left MCA distribution (series 100, image 38). Faint petechial hemorrhage posteriorly without hemorrhagic transformation. No associated mass effect. No  other evidence for acute or subacute ischemia. No mass lesion, midline shift or mass effect. No hydrocephalus. No extra-axial fluid collection. Major dural sinuses are grossly patent. Pituitary gland suprasellar region normal. Midline structures intact and normal. Vascular: Major intracranial vascular flow voids are maintained. Skull and upper cervical spine: Craniocervical junction normal. Upper cervical spine within normal limits. Bone marrow signal intensity normal. No scalp soft tissue abnormality. Sinuses/Orbits: Globes and orbital soft tissues normal. Paranasal sinuses clear. No mastoid effusion. Inner ear structures normal. Other: None. MRA HEAD FINDINGS ANTERIOR CIRCULATION: Study mildly degraded by motion. Distal cervical segments of the internal carotid arteries are patent with antegrade flow. Petrous, cavernous, and supraclinoid segments widely patent bilaterally. ICA termini patent. A1 segments patent. Normal anterior communicating artery. Anterior cerebral arteries widely patent to their distal aspects. M1 segments patent without stenosis or occlusion. Normal MCA bifurcations. Distal MCA branches well perfused and symmetric. POSTERIOR CIRCULATION: Vertebral arteries patent to the vertebrobasilar junction without stenosis. Posterior inferior cerebral arteries patent bilaterally. Basilar artery widely patent. Superior cerebral arteries patent bilaterally. Both of the  posterior cerebral arteries primarily supplied via the basilar and are well perfused to their distal aspects. No aneurysm. IMPRESSION: MRI HEAD IMPRESSION:: 1. 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 Minimal faint petechial hemorrhage without hemorrhagic transformation or mass effect. 2. Underlying chronic small vessel ischemic changes, mild to moderate for age. MRA HEAD IMPRESSION: Negative intracranial MRA. No large vessel occlusion. No hemodynamically significant or correctable stenosis identified. Electronically Signed   By: Jeannine Boga M.D.   On: 06/11/2017 22:54      Assessment/Plan 1.  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  This was discussed with the patient and her husband extensively..  The risks benefits as well as alternative therapies were also reviewed in great detail.  A total of 70 minutes was spent in discussion with the patient and her family reviewing the natural history of carotid disease alternative therapies such as stenting as well as medical management were discussed in detail.  All questions have been answered.  Patient agrees to proceed. 2.  Hyperlipidemia: Patient will continue her statin 3.  Hypertension: Patient has been treated recently for hypertension but it is unclear as to whether she truly has hypertension.  Her blood pressure 1 be monitored postoperatively and we will determine what medications she should be discharged on based on her postoperative course. 4.  Anxiety: Patient will be can continued on Xanax.  Hortencia Pilar, MD  06/14/2017 7:40 AM    This note was created with Dragon medical transcription system.  Any error is purely unintentional

## 2017-06-14 NOTE — Anesthesia Procedure Notes (Signed)
Procedure Name: Intubation Date/Time: 06/14/2017 7:58 AM Performed by: Jonna Clark, CRNA Pre-anesthesia Checklist: Patient identified, Patient being monitored, Timeout performed, Emergency Drugs available and Suction available Patient Re-evaluated:Patient Re-evaluated prior to induction Oxygen Delivery Method: Circle system utilized Preoxygenation: Pre-oxygenation with 100% oxygen Induction Type: IV induction Ventilation: Mask ventilation without difficulty Laryngoscope Size: Miller and 2 Grade View: Grade I Tube type: Oral Tube size: 7.0 mm Number of attempts: 1 Placement Confirmation: ETT inserted through vocal cords under direct vision,  positive ETCO2 and breath sounds checked- equal and bilateral Secured at: 21 cm Tube secured with: Tape Dental Injury: Teeth and Oropharynx as per pre-operative assessment

## 2017-06-14 NOTE — Anesthesia Post-op Follow-up Note (Signed)
Anesthesia QCDR form completed.        

## 2017-06-14 NOTE — Progress Notes (Signed)
[facility] [place] NAME: Jennifer Oconnor    MR#:  [id number]  DATE OF BIRTH:  04/25/1961  SUBJECTIVE:  CHIEF COMPLAINT:   Chief Complaint  Patient presents with  . Weakness  no complaints, just returned from [facility] REVIEW OF SYSTEMS:  Review of Systems  Constitutional: Negative for chills, fever and weight loss.  HENT: Negative for nosebleeds and sore throat.   Eyes: Negative for blurred vision.  Respiratory: Negative for cough, shortness of breath and wheezing.   Cardiovascular: Negative for chest pain, orthopnea, leg swelling and PND.  Gastrointestinal: Negative for abdominal pain, constipation, diarrhea, heartburn, nausea and vomiting.  Genitourinary: Negative for dysuria and urgency.  Musculoskeletal: Negative for back pain.  Skin: Negative for rash.  Neurological: Positive for speech change. Negative for dizziness, focal weakness and headaches.  Endo/Heme/Allergies: Does not bruise/bleed easily.  Psychiatric/Behavioral: Negative for depression.   DRUG ALLERGIES:  No Known Allergies VITALS:  Blood pressure 134/77, pulse 95, temperature 97.8 F (36.6 C), temperature source Axillary, resp. rate 17, height 5' (1.524 m), weight 71.6 kg (157 lb 12.8 oz), SpO2 96 %. PHYSICAL EXAMINATION:  Physical Exam  Constitutional: She is oriented to person, place, and time and well-developed, well-nourished, and in no distress.  HENT:  Head: Normocephalic and atraumatic.  Eyes: Conjunctivae and EOM are normal. Pupils are equal, round, and reactive to light.  Neck: Normal range of motion. Neck supple. No tracheal deviation present. No thyromegaly present.  Cardiovascular: Normal rate, regular rhythm and normal heart sounds.  Pulmonary/Chest: Effort normal and breath sounds normal. No respiratory distress. She has no wheezes. She exhibits no tenderness.  Abdominal: Soft. Bowel sounds are normal. She exhibits no distension. There is no  tenderness.  Musculoskeletal: Normal range of motion.  Neurological: She is alert and oriented to person, place, and time. No cranial nerve deficit.  Skin: Skin is warm and dry. No rash noted.  Psychiatric: Mood and affect normal.   LABORATORY PANEL:  Female CBC Recent Labs  Lab 06/14/17 0506  WBC 7.3  HGB 13.2  HCT 39.5  PLT 262   ------------------------------------------------------------------------------------------------------------------ Chemistries  Recent Labs  Lab 06/11/17 1812 06/14/17 0506  NA 137 138  K 4.8 3.8  CL 105 106  CO2 23 23  GLUCOSE 119* 102*  BUN 16 19  CREATININE 1.24* 0.93  CALCIUM 9.7 8.8*  AST 31  --   ALT 18  --   ALKPHOS 90  --   BILITOT 1.1  --    RADIOLOGY:  No results found. ASSESSMENT AND PLAN:  Jennifer Oconnor  is a 57 y.o. female with a known history of anxiety, medication noncompliance and hyperlipidemia admitted for new stroke  1.  Acute CVA left frontal lobe -MRI shows multiple areas of acute infarct in the distribution of left MCA, small petechial hemorrhage noted as well -Per neurology this is likely embolic -Continue aspirin and Plavix for now per neurology - has significant Carotid dz  2. Carotid stenosis - left carotid endarterectomy done today - Appreciate Vascular surgery assistance  3. Hyperlipidemia -Continue statins  4 chronic anxiety continue Xanax and buspirone  5. Tobacco abuse Smoking cessation advised 4 minutes spent  6. Elevated blood pressure without diagnosis of hypertension - allow permissive hypertension -PRN hydralazine for systolic greater than 409         All the records are reviewed and case discussed with Care Management/Social Worker. Management plans discussed  with the patient, family and they are in agreement.  CODE STATUS: Full Code  TOTAL TIME TAKING CARE OF THIS PATIENT: 35 minutes.   More than 50% of the time was spent in counseling/coordination of care: YES  POSSIBLE  D/C IN 1-2 DAYS, DEPENDING ON CLINICAL CONDITION.   Max Sane M.D on 06/14/2017 at 10:27 PM  Between 7am to 6pm - Pager - [phone]  After 6pm go to [email or web address] - Proofreader  [facility]  Office  [phone]  CC: Primary care physician; Albina Billet, MD  Note: This dictation was prepared with Dragon dictation along with smaller phrase technology. Any transcriptional errors that result from this process are unintentional.

## 2017-06-14 NOTE — Op Note (Signed)
[facility]   OPERATIVE NOTE  PROCEDURE:   1.  left carotid endarterectomy with CorMatrix arterial patch reconstruction  PRE-OPERATIVE DIAGNOSIS: 1.  Critical carotid stenosis 2. hyperlipidemia  POST-OPERATIVE DIAGNOSIS: same as above   SURGEON: Katha Cabal, MD  ASSISTANT(S): Ms Hezzie Bump  ANESTHESIA: general  ESTIMATED BLOOD LOSS: 75 cc  FINDING(S): 1.  Extensive calcified carotid plaque.  SPECIMEN(S):  Carotid plaque (sent to Pathology)  INDICATIONS:   Treva Huyett is a 57 y.o. y.o. female who presents with left carotid stenosis of 75 %.  The risks, benefits, and alternatives to carotid endarterectomy were discussed with the patient. The differences between carotid stenting and carotid endarterectomy were reviewed.  The patient voiced understanding and appears to be aware that the risks of carotid endarterectomy include but are not limited to: bleeding, infection, stroke, myocardial infarction, death, cranial nerve injuries both temporary and permanent, neck hematoma, possible airway compromise, labile blood pressure post-operatively, cerebral hyperperfusion syndrome, and possible need for additional interventions in the future. The patient is aware of the risks and agrees to proceed forward with the procedure.  DESCRIPTION: After full informed written consent was obtained from the patient, the patient was brought back to the operating room and placed supine upon the operating table.  Prior to induction, the patient received IV antibiotics.  After obtaining adequate anesthesia, the patient was placed a supine position with a shoulder roll in place and the patient's neck slightly hyperextended and rotated away from the surgical site.  The patient was prepped in the standard fashion for a carotid endarterectomy.    The incision was made on the left neck anterior to the sternocleidomastoid muscle and dissected down through the subcutaneous tissue.   The platysmas was opened with electrocautery.  The internal jugular vein and facial vein were identified.  The facial vein is ligated and divided between 2-0 silk ties.  The omohyoid was identified in the common carotid artery exposed at this level. The dissection was there in carried out along the carotid artery in a cranial direction.  The dissection was then carried along periadventitial plane along the common carotid artery up to the bifurcation. The external carotid artery was identified. Vessel loops were then placed around the external carotid artery as well as the superior thyroid artery. In the process of this dissection, the hypoglossal nerve was identified and protected from harm.  The internal carotid artery was then dissected circumferentially just beyond an area in the internal carotid artery distal to the plaque.    At this point, we gave the patient 7000 units of intravenous heparin.  After this was allowed to circulate for several minutes, the common carotid followed by the external carotid and then the internal carotid artery were clamped.  Arteriotomy was made in the common carotid artery with a 11 blade, and extended the arteriotomy with a Potts scissor down into the common carotid artery, then the arteriotomy was carried through the bifurcation into the internal carotid artery until I reached an area that was not diseased.  At this point, a Sundt shunt was placed.  The endarterectomy was begun in the common carotid artery with a Garment/textile technologist and carried this dissection down into the common carotid artery circumferentially.  Then I transected the plaque at a segment where it was adherent and transected the plaque with Potts scissors.  I then carried this dissection up into the external carotid artery.  The plaque was extracted by unclamping the external carotid  artery and performing an eversion endarterectomy.  The dissection was then carried into the internal carotid artery where a   feathered end point was created.  The plaque was passed off the field as a specimen.  The distal endpoint was tacked down with 6 interrupted 7-0 Prolene sutures.  A CorMatrix arterial patch was delivered onto the field and trimmed appropriately for the artery and sewed in place with 6-0 Prolene using a 4 quadrant technique.  The medial suture line was completed and the lateral suture line was run approximately one quarter the length of the arteriotomy.  Prior to completing this patch angioplasty, the shunt was removed, the internal carotid artery was flushed and there was excellent backbleeding.  The carotid artery repair was flushed with heparinized saline and then the patch angioplasty was completed in the usual fashion.  The flow was then reestablished first to the external carotid artery and then the internal carotid artery to prevent distal embolization.   Several minutes of pressure were held and 6-0 Prolene patch sutures were used as need for hemostasis.  At this point, I placed Surgicel and Evicel topical hemostatic agents.  There was no more active bleeding in the surgical site.  The sternocleidomastoid space was closed with three interrupted 3-0 Vicryl sutures. I then reapproximated the platysma muscle with a running stitch of 3-0 Vicryl.  The skin was then closed with a running subcuticular 4-0 Monocryl.  The skin was then cleaned, dried and Dermabond was used to reinforce the skin closure.  The patient awakened and was taken to the recovery room in stable condition, following commands and moving all four extremities without any apparent deficits.    COMPLICATIONS: none  CONDITION: stable  Hortencia Pilar 06/14/2017<10:19 AM

## 2017-06-14 NOTE — Progress Notes (Signed)
RN called to speak with Dr. Delana Meyer and asked for order for ice chips per patient's request and order for IV pain medication because non is ordered.  Surgery nurse stated MD gave order for ice chips but would have to look about orders for pain med.

## 2017-06-14 NOTE — Anesthesia Procedure Notes (Signed)
Performed by: Rodricus Candelaria, CRNA       

## 2017-06-15 DIAGNOSIS — I639 Cerebral infarction, unspecified: Secondary | ICD-10-CM | POA: Diagnosis not present

## 2017-06-15 LAB — BASIC METABOLIC PANEL
Anion gap: 7 (ref 5–15)
BUN: 15 mg/dL (ref 6–20)
CHLORIDE: 108 mmol/L (ref 101–111)
CO2: 22 mmol/L (ref 22–32)
CREATININE: 0.98 mg/dL (ref 0.44–1.00)
Calcium: 8.5 mg/dL — ABNORMAL LOW (ref 8.9–10.3)
GFR calc Af Amer: >60 mL/min (ref >60)
GFR calc non Af Amer: >60 mL/min (ref >60)
Glucose, Bld: 151 mg/dL — ABNORMAL HIGH (ref 65–99)
Potassium: 4.2 mmol/L (ref 3.5–5.1)
SODIUM: 137 mmol/L (ref 135–145)

## 2017-06-15 LAB — CBC
HCT: 37.8 % (ref 35.0–47.0)
HEMOGLOBIN: 12.7 g/dL (ref 12.0–16.0)
MCH: 31.9 pg (ref 26.0–34.0)
MCHC: 33.5 g/dL (ref 32.0–36.0)
MCV: 95.2 fL (ref 80.0–100.0)
Platelets: 274 10*3/uL (ref 150–440)
RBC: 3.97 MIL/uL (ref 3.80–5.20)
RDW: 13.4 % (ref 11.5–14.5)
WBC: 10.2 10*3/uL (ref 3.6–11.0)

## 2017-06-15 LAB — CARDIOLIPIN ANTIBODIES, IGG, IGM, IGA
Anticardiolipin IgA: 23 APL U/mL — ABNORMAL HIGH (ref 0–11)
Anticardiolipin IgG: <9 GPL U/mL (ref 0–14)
Anticardiolipin IgM: 16 MPL U/mL — ABNORMAL HIGH (ref 0–12)

## 2017-06-15 MED ORDER — SODIUM CHLORIDE 0.9% FLUSH
3.0000 mL | Freq: Two times a day (BID) | INTRAVENOUS | Status: DC
  Administered 2017-06-15: 3 mL via INTRAVENOUS

## 2017-06-15 NOTE — Evaluation (Signed)
Occupational Therapy Evaluation Patient Details Name: Jennifer Oconnor MRN: [id number] DOB: 01/06/1961 Today's Date: 06/15/2017    History of Present Illness 57 y.o. female with a history of HTN noncompliant with medications admitted for stroke work up, MRI significant for multifocal acute infarct to L frontal/parietal lobes with faint petechial hemorrhage; CTA significant for filling defect to L ICA (likely source of embolism).  Pt now s/p L carotid endarterectomy with new therapy orders.   Clinical Impression   Pt seen for OT re-evaluation after having L carotid endarterectomy performed, POD#1. Pt demonstrates similar physical and cognitive deficits from previous evaluation completed on 06/12/17, however, demonstrating improved RUE strength (was 4/5, now 4+/5) and now with mildly impaired balance during functional mobility in room while completing grooming tasks at sink, requiring SBA for transfers and ambulating (no AD). Pt participated in seated cognitive skills activity involving safety awareness, medication mgt, and meal prep requiring additional time to process and verbalize, mild word finding difficulty (noted in previous evaluation), and visual aides. Pt fatigued this date, lots of family/visitors. Encouraged pt/spouse to minimize visitors, noise, and lighting to allow pt time to rest. Recommend OP OT services with focus on higher level cognitive tasks to maximize safety and return to PLOF.     Follow Up Recommendations  Outpatient OT    Equipment Recommendations  None recommended by OT    Recommendations for Other Services       Precautions / Restrictions Precautions Precautions: Fall Restrictions Weight Bearing Restrictions: No      Mobility Bed Mobility Overal bed mobility: Modified Independent             General bed mobility comments: cautious, slow, increased effort to perform  Transfers Overall transfer level: Needs assistance Equipment used: None Transfers:  Sit to/from Stand Sit to Stand: Supervision         General transfer comment: SBA with pt noting mild lightheadedness that resolved quickly    Balance Overall balance assessment: Needs assistance Sitting-balance support: No upper extremity supported;Feet supported Sitting balance-Leahy Scale: Good     Standing balance support: No upper extremity supported Standing balance-Leahy Scale: Fair Standing balance comment: fair                           ADL either performed or assessed with clinical judgement   ADL Overall ADL's : Needs assistance/impaired     Grooming: Supervision/safety;Standing Grooming Details (indicate cue type and reason): SBA while standing at sink to wash hands, with increased processing time to problem solve and locate the soap dispenser Upper Body Bathing: Sitting;Supervision/ safety   Lower Body Bathing: Sit to/from stand;Min guard   Upper Body Dressing : Sitting;Set up;Supervision/safety   Lower Body Dressing: Sit to/from stand;Min guard;Set up   Toilet Transfer: Supervision/safety;Comfort height toilet;Ambulation           Functional mobility during ADLs: Supervision/safety General ADL Comments: generally supervision to min guard for ADL tasks with increased time needed to perform     Vision Baseline Vision/History: Wears glasses Wears Glasses: At all times Patient Visual Report: No change from baseline Vision Assessment?: No apparent visual deficits     Perception     Praxis      Pertinent Vitals/Pain Pain Assessment: 0-10 Pain Score: 4  Pain Location: incision site on L side of neck Pain Intervention(s): Limited activity within patient's tolerance;Monitored during session;Repositioned     Hand Dominance Right   Extremity/Trunk Assessment Upper Extremity Assessment Upper  Extremity Assessment: RUE deficits/detail(LUE WFL) RUE Deficits / Details: 4+/5 (improvement from initial evaluation), slightly impaired  speed/coordination, sensation intact   Lower Extremity Assessment Lower Extremity Assessment: Defer to PT evaluation   Cervical / Trunk Assessment Cervical / Trunk Assessment: Normal   Communication Communication Communication: Other (comment)(intermittent word finding difficulties (greater with confrontational speech))   Cognition Arousal/Alertness: Awake/alert Behavior During Therapy: WFL for tasks assessed/performed Overall Cognitive Status: Impaired/Different from baseline Area of Impairment: Attention;Memory;Problem solving;Following commands                   Current Attention Level: Focused Memory: Decreased short-term memory Following Commands: Follows one step commands consistently;Follows multi-step commands with increased time     Problem Solving: Slow processing;Requires verbal cues General Comments: significant impairment in problem-solving, sequencing and both immediate and delayed recall of new information   General Comments       Exercises Other Exercises Other Exercises: cognitive exercises to challenge problem solving, safety awareness, and medication mgt with pt requiring additional time, verbal cues, and visual aides   Shoulder Instructions      Home Living Family/patient expects to be discharged to:: Private residence Living Arrangements: Spouse/significant other Available Help at Discharge: Family;Available PRN/intermittently Type of Home: House Home Access: Stairs to enter [employer] of Steps: 3 Entrance Stairs-Rails: Can reach both;Left;Right Home Layout: One level     Bathroom Shower/Tub: Teacher, early years/pre: Standard     Home Equipment: None   Additional Comments: Husband aware of need for 24/7 supervision upon discharge; working to have this set up in home.  Lives With: Spouse    Prior Functioning/Environment Level of Independence: Independent        Comments: Indep with ADLs, household and  community mobility wihtout assist device; denies fall history outside of this event.        OT Problem List: Decreased strength;Decreased cognition;Impaired balance (sitting and/or standing)      OT Treatment/Interventions: Self-care/ADL training;Cognitive remediation/compensation;Therapeutic activities;Therapeutic exercise;Patient/family education;Visual/perceptual remediation/compensation    OT Goals(Current goals can be found in the care plan section) Acute Rehab OT Goals Patient Stated Goal: get better OT Goal Formulation: With patient/family Time For Goal Achievement: 06/26/17 Potential to Achieve Goals: Good  OT Frequency: Min 3X/week   Barriers to D/C:            Co-evaluation              AM-PAC PT "6 Clicks" Daily Activity     Outcome Measure Help from another person eating meals?: None Help from another person taking care of personal grooming?: A Little Help from another person toileting, which includes using toliet, bedpan, or urinal?: A Little Help from another person bathing (including washing, rinsing, drying)?: A Little Help from another person to put on and taking off regular upper body clothing?: A Little Help from another person to put on and taking off regular lower body clothing?: A Little 6 Click Score: 19   End of Session    Activity Tolerance: Patient tolerated treatment well Patient left: in bed;with call bell/phone within reach;with family/visitor present;with bed alarm set  OT Visit Diagnosis: Other symptoms and signs involving cognitive function;Hemiplegia and hemiparesis;Other abnormalities of gait and mobility (R26.89) Hemiplegia - Right/Left: Right Hemiplegia - dominant/non-dominant: Dominant Hemiplegia - caused by: Cerebral infarction                Time: 3818-2993 OT Time Calculation (min): 24 min Charges:  OT General Charges $OT Visit: 1 Visit  OT Evaluation $OT Re-eval: 1 Re-eval OT Treatments $Cognitive Skills Development: 8-22  mins  [name], MPH, MS, OTR/L ascom [phone] 06/15/17, 4:40 PM

## 2017-06-15 NOTE — Progress Notes (Signed)
1 Day Post-Op   Subjective/Chief Complaint: Doing OK. Notes mild neck discomfort at incision. Noted continued mild confusion and word finding difficulty.   Objective: Vital signs in last 24 hours: Temp:  [96.8 F (36 C)-98.3 F (36.8 C)] 97.8 F (36.6 C) (01/12 0800) Pulse Rate:  [65-111] 79 (01/12 0900) Resp:  [10-20] 16 (01/12 0900) BP: (109-150)/(54-81) 136/61 (01/12 0900) SpO2:  [92 %-100 %] 95 % (01/12 0900) Arterial Line BP: (80-163)/(53-104) 80/64 (01/12 0400) Last BM Date: 06/13/17  Intake/Output from previous day: 01/11 0701 - 01/12 0700 In: 2837.5 [I.V.:2737.5; IV Piggyback:100] Out: 1965 [Urine:1890; Blood:75] Intake/Output this shift: Total I/O In: 350 [I.V.:300; IV Piggyback:50] Out: 210 [Urine:210]  Gen: Alert, NAD CV: RRR Neck: incision- C/D/I, soft Neuro: Motor/sensory intact, right- 4/5 strength, some word finding difficulty but appropriate.  Lab Results:  Recent Labs    06/14/17 0506 06/15/17 0402  WBC 7.3 10.2  HGB 13.2 12.7  HCT 39.5 37.8  PLT 262 274   BMET Recent Labs    06/14/17 0506 06/15/17 0402  NA 138 137  K 3.8 4.2  CL 106 108  CO2 23 22  GLUCOSE 102* 151*  BUN 19 15  CREATININE 0.93 0.98  CALCIUM 8.8* 8.5*   PT/INR No results for input(s): LABPROT, INR in the last 72 hours. ABG No results for input(s): PHART, HCO3 in the last 72 hours.  Invalid input(s): PCO2, PO2  Studies/Results: No results found.  Anti-infectives: Anti-infectives (From admission, onward)   Start     Dose/Rate Route Frequency Ordered Stop   06/14/17 1600  ceFAZolin (ANCEF) IVPB 1 g/50 mL premix  Status:  Discontinued     1 g 100 mL/hr over 30 Minutes Intravenous Every 8 hours 06/14/17 1259 06/14/17 1308   06/14/17 1600  ceFAZolin (ANCEF) 1 g in dextrose 5 % 50 mL IVPB     1 g 100 mL/hr over 30 Minutes Intravenous Every 8 hours 06/14/17 1308 06/15/17 0912   06/14/17 0000  ceFAZolin (ANCEF) IVPB 1 g/50 mL premix    Comments:  Send with pt to OR    1 g 100 mL/hr over 30 Minutes Intravenous On call 06/13/17 1912 06/14/17 0846      Assessment/Plan: s/p Procedure(s): ENDARTERECTOMY CAROTID (Left) Advance diet Transfer to Floor  OOB with PT/OT Aspirin  LOS: 4 days    Jennifer Oconnor A 06/15/2017

## 2017-06-15 NOTE — Progress Notes (Signed)
Subjective: Patient has remained stable overnight.  No new neurological complaints.  S/p CEA  Objective: Current vital signs: BP 114/74 (BP Location: Right Arm)   Pulse (!) 108   Temp 97.7 F (36.5 C) (Axillary)   Resp (!) 22   Ht 5' (1.524 m)   Wt 157 lb 12.8 oz (71.6 kg)   SpO2 97%   BMI 30.82 kg/m  Vital signs in last 24 hours: Temp:  [96.8 F (36 C)-98.3 F (36.8 C)] 97.7 F (36.5 C) (01/12 1200) Pulse Rate:  [65-108] 108 (01/12 1200) Resp:  [10-22] 22 (01/12 1200) BP: (109-148)/(54-79) 114/74 (01/12 1200) SpO2:  [93 %-100 %] 97 % (01/12 1200) Arterial Line BP: (80-163)/(57-104) 80/64 (01/12 0400)  Intake/Output from previous day: 01/11 0701 - 01/12 0700 In: 2837.5 [I.V.:2737.5; IV Piggyback:100] Out: 1965 [Urine:1890; Blood:75] Intake/Output this shift: Total I/O In: 588 [P.O.:238; I.V.:300; IV Piggyback:50] Out: 210 [Urine:210] Nutritional status: Diet Heart Room service appropriate? Yes; Fluid consistency: Thin  Neurologic Exam: Mental Status: Alert, oriented, thought content appropriate.  Speech fluent without evidence of aphasia but patient has some delay before responses as if she has to take some time to think.  Able to follow 3 step commands without difficulty. Cranial Nerves: II: Discs flat bilaterally; Visual fields grossly normal, pupils equal, round, reactive to light and accommodation III,IV, VI: ptosis not present, extra-ocular motions intact bilaterally V,VII: smile symmetric, facial light touch sensation normal bilaterally VIII: hearing normal bilaterally IX,X: gag reflex present XI: bilateral shoulder shrug XII: midline tongue extension Motor: Right :  Upper extremity   4+/5             with pronator drift                               Left:     Upper extremity   5/5             Lower extremity   5/5                                                                          Lower extremity   5/5   Lab Results: Basic Metabolic Panel: Recent Labs   Lab 06/11/17 1812 06/14/17 0506 06/15/17 0402  NA 137 138 137  K 4.8 3.8 4.2  CL 105 106 108  CO2 23 23 22   GLUCOSE 119* 102* 151*  BUN 16 19 15   CREATININE 1.24* 0.93 0.98  CALCIUM 9.7 8.8* 8.5*    Liver Function Tests: Recent Labs  Lab 06/11/17 1812  AST 31  ALT 18  ALKPHOS 90  BILITOT 1.1  PROT 7.8  ALBUMIN 4.5   No results for input(s): LIPASE, AMYLASE in the last 168 hours. No results for input(s): AMMONIA in the last 168 hours.  CBC: Recent Labs  Lab 06/11/17 1812 06/14/17 0506 06/15/17 0402  WBC 9.4 7.3 10.2  NEUTROABS 5.4  --   --   HGB 14.7 13.2 12.7  HCT 43.3 39.5 37.8  MCV 94.8 95.2 95.2  PLT 300 262 274    Cardiac Enzymes: No results for input(s): CKTOTAL, CKMB, CKMBINDEX, TROPONINI in the last 168 hours.  Lipid Panel: Recent  Labs  Lab 06/11/17 1812 06/12/17 0604  CHOL 247* 247*  TRIG 512* 755*  HDL 35* 32*  CHOLHDL 7.1 7.7  VLDL UNABLE TO CALCULATE IF TRIGLYCERIDE OVER 400 mg/dL UNABLE TO CALCULATE IF TRIGLYCERIDE OVER 400 mg/dL  LDLCALC UNABLE TO CALCULATE IF TRIGLYCERIDE OVER 400 mg/dL UNABLE TO CALCULATE IF TRIGLYCERIDE OVER 400 mg/dL    CBG: No results for input(s): GLUCAP in the last 168 hours.  Microbiology: Results for orders placed or performed during the hospital encounter of 06/11/17  MRSA PCR Screening     Status: None   Collection Time: 06/14/17  1:13 PM  Result Value Ref Range Status   MRSA by PCR NEGATIVE NEGATIVE Final    Comment:        The GeneXpert MRSA Assay (FDA approved for NASAL specimens only), is one component of a comprehensive MRSA colonization surveillance program. It is not intended to diagnose MRSA infection nor to guide or monitor treatment for MRSA infections. Performed at [facility], [address] [place], [place] [address]     Coagulation Studies: No results for input(s): LABPROT, INR in the last 72 hours.  Imaging: No results found.  Medications:  I have reviewed the  patient's current medications. Scheduled: . aspirin EC  81 mg Oral Daily  . atorvastatin  80 mg Oral q1800  . buPROPion  300 mg Oral Daily  . docusate sodium  100 mg Oral Daily  . enoxaparin (LOVENOX) injection  40 mg Subcutaneous Q24H  . pantoprazole (PROTONIX) IV  40 mg Intravenous QHS    Assessment/Plan: Patient with no new neurological complaints overnight.  CTA of the neck reviewed on yesterday.  CT of the neck shows 20% diameter stenosis in the proximal right internal carotid artery.  There is felt to be 35% diameter stenosis in the proximal left internal carotid artery with a small linear filling defect representing either thrombus or intimal flap.  Pt is s/p CEA  No new neurological deficits Probably can come out of ICU  06/15/2017  1:22 PM

## 2017-06-15 NOTE — Progress Notes (Addendum)
Report called to [facility] on 2A, VSS on room air, pt wil transfer via wheelchair to 237, chart, belongings and meds will accompany her.  Pt and family agreeable to transfer  CCMD notified of transfer, new room#, new RN name, and portable tele box#  Pt up to [facility] right before transfer, urine cloudy and had some odor, [facility] informed when patient arrived in room 237

## 2017-06-15 NOTE — Progress Notes (Signed)
Left arterial line discontinued per order. Catheter intact. Pressure held to left wrist. No bleeding noted.

## 2017-06-15 NOTE — Plan of Care (Signed)
Pt up to [facility] and SOB w/ stand by assist and w/no issues, VSS, delayed responses but able to communicate her needs, verbalizes understanding of her new medications and need to control her triglycerides and cholesterol.  Verbalized need for assistance in the home for a period of time following DC.  No s/sx of infection.

## 2017-06-15 NOTE — Progress Notes (Signed)
[place] [facility] [place] NAME: Jennifer Oconnor    MR#:  [id number]  DATE OF BIRTH:  [birth year]  SUBJECTIVE:  CHIEF COMPLAINT:   Chief Complaint  Patient presents with  . Weakness   Patient is resting comfortably reporting left neck pain, had carotid endarterectomy yesterday  REVIEW OF SYSTEMS:  Review of Systems  Constitutional: Negative for chills, fever and weight loss.  HENT: Negative for nosebleeds and sore throat.         left carotid endarterectomy with clean dressing  Eyes: Negative for blurred vision.  Respiratory: Negative for cough, shortness of breath and wheezing.   Cardiovascular: Negative for chest pain, orthopnea, leg swelling and PND.  Gastrointestinal: Negative for abdominal pain, constipation, diarrhea, heartburn, nausea and vomiting.  Genitourinary: Negative for dysuria and urgency.  Musculoskeletal: Negative for back pain.  Skin: Negative for rash.  Neurological: Positive for speech change. Negative for dizziness, focal weakness and headaches.  Endo/Heme/Allergies: Does not bruise/bleed easily.  Psychiatric/Behavioral: Negative for depression.   DRUG ALLERGIES:  No Known Allergies VITALS:  Blood pressure (!) 169/70, pulse (!) 108, temperature (!) 97.4 F (36.3 C), temperature source Oral, resp. rate 16, height 5\' 2"  (1.575 m), weight 71.3 kg (157 lb 1.6 oz), SpO2 99 %. PHYSICAL EXAMINATION:  Physical Exam  Constitutional: She is oriented to person, place, and time and well-developed, well-nourished, and in no distress.  HENT:  Head: Normocephalic and atraumatic.  Eyes: Conjunctivae and EOM are normal. Pupils are equal, round, and reactive to light.  Neck: Normal range of motion. Neck supple. No tracheal deviation present. No thyromegaly present.  Cardiovascular: Normal rate, regular rhythm and normal heart sounds.  Pulmonary/Chest: Effort normal and breath sounds normal. No respiratory distress. She has no  wheezes. She exhibits no tenderness.  Abdominal: Soft. Bowel sounds are normal. She exhibits no distension. There is no tenderness.  Musculoskeletal: Normal range of motion.  Neurological: She is alert and oriented to person, place, and time. No cranial nerve deficit.  Skin: Skin is warm and dry. No rash noted.  Psychiatric: Mood and affect normal.   LABORATORY PANEL:  Female CBC Recent Labs  Lab 06/15/17 0402  WBC 10.2  HGB 12.7  HCT 37.8  PLT 274   ------------------------------------------------------------------------------------------------------------------ Chemistries  Recent Labs  Lab 06/11/17 1812  06/15/17 0402  NA 137   < > 137  K 4.8   < > 4.2  CL 105   < > 108  CO2 23   < > 22  GLUCOSE 119*   < > 151*  BUN 16   < > 15  CREATININE 1.24*   < > 0.98  CALCIUM 9.7   < > 8.5*  AST 31  --   --   ALT 18  --   --   ALKPHOS 90  --   --   BILITOT 1.1  --   --    < > = values in this interval not displayed.   RADIOLOGY:  No results found. ASSESSMENT AND PLAN:  Jennifer Oconnor  is a 57 y.o. female with a known history of anxiety, medication noncompliance and hyperlipidemia admitted for new stroke  1.  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 Carotid stenosis - left carotid endarterectomy done on June 14, 2017 - Appreciate Vascular surgery assistance -Advance diet as tolerated today  3. Hyperlipidemia -Continue statins  4 chronic anxiety continue Xanax and buspirone  5. Tobacco abuse Smoking cessation advised   6. Elevated blood pressure without diagnosis of hypertension - allow permissive hypertension -PRN hydralazine for systolic greater than 829         All the records are reviewed  and case discussed with Care Management/Social Worker. Management plans discussed with the patient, family and they are in agreement.  CODE STATUS: Full Code  TOTAL TIME TAKING CARE OF THIS PATIENT: 35 minutes.   More than 50% of the time was spent in counseling/coordination of care: YES  POSSIBLE D/C IN 1-2 DAYS, DEPENDING ON CLINICAL CONDITION.   Nicholes Mango M.D on 06/15/2017 at 2:54 PM  Between 7am to 6pm - Pager - [phone]   After 6pm go to [email or web address] - Proofreader  [facility]  Office  [phone]  CC: Primary care physician; Albina Billet, MD  Note: This dictation was prepared with Dragon dictation along with smaller phrase technology. Any transcriptional errors that result from this process are unintentional.

## 2017-06-15 NOTE — Progress Notes (Signed)
57 y/o female with severe carotid stenosis; now s/p left carotid endarterectomy with CorMatrix arterial patch reconstruction. Reports mild pain along incision line. No headache, dizziness, nausea and vomiting. Blood pressure and HR normal.  Will continue to monitor   [facility] ANP-BC Pulmonary and Critical Care Medicine [facility] Pager [phone] or [phone]

## 2017-06-15 NOTE — Evaluation (Signed)
Physical Therapy Evaluation Patient Details Name: Jennifer Oconnor MRN: [id number] DOB: 1961-03-24 Today's Date: 06/15/2017   History of Present Illness  57 y.o. female with a history of HTN noncompliant with medications admitted for stroke work up, MRI significant for multifocal acute infarct to L frontal/parietal lobes with faint petechial hemorrhage; CTA significant for filling defect to L ICA (likely source of embolism).  Pt now s/p L carotid endarterectomy with new therapy orders.  Clinical Impression  Pt is a 56 year old female who lives in a one story home with her husband.  Pt was able to perform bed mobility with modified independence, requiring increased time to do so.  Pt is able to perform STS transfers without an AD but required close CGA due to fatigue and muscle weakness.  Pt completed 60 ft of ambulation without an AD, CGA, and reported fatigue following.  Pt presented with several lateral LOB's which she was able to self correct during ambulation.  Pt presented with balance deficits during higher level balance activity and required unilateral support at counter during activity.  Pt will continue to benefit from skilled PT with focus on strength, balance, tolerance to activity and safe functional mobility.    Follow Up Recommendations Outpatient PT(Pt may qualify for [place] clinic at [facility] due to insurance status.)    Equipment Recommendations  Rolling walker with 5" wheels    Recommendations for Other Services       Precautions / Restrictions Precautions Precautions: Fall Restrictions Weight Bearing Restrictions: No      Mobility  Bed Mobility Overal bed mobility: Modified Independent             General bed mobility comments: Requires bed rail for bed mobility as well as increased time to perform supine to sit.  Transfers Overall transfer level: Modified independent Equipment used: None Transfers: Sit to/from Stand Sit to Stand: Min guard          General transfer comment: Pt is able to perform STS without AD but required close CGA due to weakess and feeling somewhat unsteady on feet.  Ambulation/Gait Ambulation/Gait assistance: Min guard Ambulation Distance (Feet): 75 Feet       Gait velocity interpretation: Below normal speed for age/gender General Gait Details: Pt presented with slow gait, experiencing a lateral deviation every 4-5 steps with self correction.  Pt presented with low foot clearance and decreased hip and knee flexion during swing phase.  Stairs            Wheelchair Mobility    Modified Rankin (Stroke Patients Only)       Balance Overall balance assessment: Needs assistance Sitting-balance support: Single extremity supported Sitting balance-Leahy Scale: Good     Standing balance support: Single extremity supported Standing balance-Leahy Scale: Fair Standing balance comment: Pt requires unilateral UE support during tandem stance, tandem walking and quiet stance EC.  Pt is able to maintain quiet stance with feet together EO for 5-10 sec with increased a-p sway.                             Pertinent Vitals/Pain Pain Assessment: No/denies pain Pain Score: 4  Pain Location: incision site on L side of neck Pain Intervention(s): Limited activity within patient's tolerance;Monitored during session;Repositioned    Home Living Family/patient expects to be discharged to:: Private residence Living Arrangements: Spouse/significant other Available Help at Discharge: Family Type of Home: House Home Access: Stairs to enter Entrance  Stairs-Rails: Can reach both Entrance Stairs-Number of Steps: 3 Home Layout: One level Home Equipment: Walker - 2 wheels Additional Comments: Husband aware of need for 24/7 supervision upon discharge; working to have this set up in home.    Prior Function Level of Independence: Independent         Comments: Indep with ADLs, household and community mobility  wihtout assist device; denies fall history outside of this event.     Hand Dominance   Dominant Hand: Right    Extremity/Trunk Assessment   Upper Extremity Assessment Upper Extremity Assessment: Generalized weakness RUE Deficits / Details: 4+/5 (improvement from initial evaluation), slightly impaired speed/coordination, sensation intact    Lower Extremity Assessment Lower Extremity Assessment: Generalized weakness    Cervical / Trunk Assessment Cervical / Trunk Assessment: Normal  Communication   Communication: Receptive difficulties;Expressive difficulties  Cognition Arousal/Alertness: Awake/alert Behavior During Therapy: WFL for tasks assessed/performed Overall Cognitive Status: Impaired/Different from baseline Area of Impairment: Attention;Memory;Problem solving;Following commands                   Current Attention Level: Focused Memory: Decreased short-term memory Following Commands: Follows one step commands consistently;Follows multi-step commands with increased time     Problem Solving: Slow processing;Requires verbal cues General Comments: significant impairment in problem-solving, sequencing and both immediate and delayed recall of new information      General Comments      Exercises Other Exercises Other Exercises: cognitive exercises to challenge problem solving, safety awareness, and medication mgt with pt requiring additional time, verbal cues, and visual aides   Assessment/Plan    PT Assessment Patient needs continued PT services  PT Problem List Decreased strength;Decreased activity tolerance;Decreased balance;Decreased mobility;Pain;Decreased coordination;Decreased cognition       PT Treatment Interventions Gait training;Stair training;Functional mobility training;Therapeutic activities;Therapeutic exercise;Balance training;Patient/family education    PT Goals (Current goals can be found in the Care Plan section)  Acute Rehab PT  Goals Patient Stated Goal: To continue with PT following DC. PT Goal Formulation: With patient Time For Goal Achievement: 06/29/17 Potential to Achieve Goals: Good    Frequency Min 2X/week   Barriers to discharge        Co-evaluation               AM-PAC PT "6 Clicks" Daily Activity  Outcome Measure Difficulty turning over in bed (including adjusting bedclothes, sheets and blankets)?: A Little Difficulty moving from lying on back to sitting on the side of the bed? : A Little Difficulty sitting down on and standing up from a chair with arms (e.g., wheelchair, bedside commode, etc,.)?: A Little Help needed moving to and from a bed to chair (including a wheelchair)?: A Little Help needed walking in hospital room?: A Little Help needed climbing 3-5 steps with a railing? : A Little 6 Click Score: 18    End of Session Equipment Utilized During Treatment: Gait belt Activity Tolerance: Patient limited by fatigue Patient left: in bed;with call bell/phone within reach;with bed alarm set;with family/visitor present   PT Visit Diagnosis: Unsteadiness on feet (R26.81);Muscle weakness (generalized) (M62.81)    Time: 1600-1620 PT Time Calculation (min) (ACUTE ONLY): 20 min   Charges:   PT Evaluation $PT Eval Low Complexity: 1 Low     PT G Codes:   PT G-Codes **NOT FOR INPATIENT CLASS** Functional Assessment Tool Used: AM-PAC 6 Clicks Basic Mobility Functional Limitation: Mobility: Walking and moving around Mobility: Walking and Moving Around Current Status (M7544): At least 60 percent but  less than 80 percent impaired, limited or restricted Mobility: Walking and Moving Around Goal Status [phone]): At least 1 percent but less than 20 percent impaired, limited or restricted    Roxanne Gates, PT, DPT   Roxanne Gates 06/15/2017, 5:06 PM

## 2017-06-16 MED ORDER — ATORVASTATIN CALCIUM 80 MG PO TABS: 80.0000 mg | ORAL_TABLET | Freq: Every day | ORAL | 0 refills | Status: AC

## 2017-06-16 MED ORDER — ASPIRIN 81 MG PO TBEC: 81.0000 mg | DELAYED_RELEASE_TABLET | Freq: Every day | ORAL | Status: AC

## 2017-06-16 MED ORDER — ACETAMINOPHEN 325 MG PO TABS
325.0000 mg | ORAL_TABLET | ORAL | Status: AC | PRN
Start: 2017-06-16 — End: ?

## 2017-06-16 MED ORDER — OXYCODONE HCL 5 MG PO TABS: 5.0000 mg | ORAL_TABLET | ORAL | 0 refills | Status: DC | PRN

## 2017-06-16 MED ORDER — CLOPIDOGREL BISULFATE 75 MG PO TABS: 75.0000 mg | ORAL_TABLET | Freq: Every day | ORAL | Status: DC

## 2017-06-16 MED ORDER — CLOPIDOGREL BISULFATE 75 MG PO TABS: 75.0000 mg | ORAL_TABLET | Freq: Every day | ORAL | 0 refills | Status: DC

## 2017-06-16 NOTE — Discharge Summary (Addendum)
[facility] NAME: Jennifer Oconnor    MR#:  [id number]  DATE OF BIRTH:  06-17-1960  DATE OF ADMISSION:  06/11/2017 ADMITTING PHYSICIAN: Fritzi Mandes, MD  DATE OF DISCHARGE: 06/16/17  PRIMARY CARE PHYSICIAN: Albina Billet, MD    ADMISSION DIAGNOSIS:  Cerebrovascular accident (CVA), unspecified mechanism ([place]) [I63.9]  DISCHARGE DIAGNOSIS:  Active Problems:   CVA (cerebral vascular accident) ([place])   SECONDARY DIAGNOSIS:   Past Medical History:  Diagnosis Date  . Anxiety   . Diverticulitis    in past  . Hypercholesteremia     HOSPITAL COURSE:   HPI  Jennifer Oconnor  is a 57 y.o. female with a known history of anxiety and hyperlipidemia comes to the emergency room after she started noticing her thought process was not proper.  She was taking a long time to process of part since morning.  She also had some numbness in her right upper extremity.  Husband came home from work and noticed similar findings and brought to the emergency room where CT of the head shows possible evolving left frontal stroke in the MCA territory Patient denies any dysphagia numbness tingling focal weakness. She received aspirin.  She is being admitted for further evaluation and management    1. 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 Carotid stenosis - left carotid endarterectomy done on June 14, 2017 - Appreciate Vascular surgery assistance, outpatient follow-up with vascular surgery in 2 weeks -Advanced diet as tolerated, pt tolerated well -Okay to discharge patient from vascular surgery  standpoint  3. Hyperlipidemia -Continue statins  4 chronic anxiety continue Xanax and buspirone  5.Tobacco abuse Smoking cessation advised   6.Elevated blood pressure without diagnosis of hypertension - allow permissive hypertension -PRN hydralazine for systolic greater than 147     DISCHARGE CONDITIONS:   stable  CONSULTS OBTAINED:  Treatment Team:  Alexis Goodell, MD Schnier, Dolores Lory, MD   PROCEDURES ENDARTERECTOMY CAROTID (Left)    DRUG ALLERGIES:  No Known Allergies  DISCHARGE MEDICATIONS:   Allergies as of 06/16/2017   No Known Allergies     Medication List    TAKE these medications   acetaminophen 325 MG tablet Commonly known as:  TYLENOL Take 1-2 tablets (325-650 mg total) by mouth every 4 (four) hours as needed for mild pain (or temp >/= 101 F).   ALPRAZolam 0.5 MG tablet Commonly known as:  XANAX Take 0.5 mg by mouth 2 (two) times daily as needed for anxiety.   aspirin 81 MG EC tablet Take 1 tablet (81 mg total) by mouth daily. Start taking on:  06/17/2017   atorvastatin 80 MG tablet Commonly known as:  LIPITOR Take 1 tablet (80 mg total) by mouth daily at 6 PM.   buPROPion 300 MG 24 hr tablet Commonly known as:  WELLBUTRIN XL Take 300 mg by mouth daily. AM   clopidogrel 75 MG tablet Commonly known as:  PLAVIX Take 1 tablet (75 mg total) by mouth daily.   estradiol 0.025 MG/24HR Commonly known as:  VIVELLE-DOT Place 1 patch onto the skin 2 (two) times a week. Wed/Sun Notes to patient:  Patch due today.  Apply one when you get home.   oxyCODONE 5 MG immediate release tablet Commonly known as:  Oxy IR/ROXICODONE Take 1-2 tablets (5-10 mg total) by mouth every 4 (four) hours as needed for moderate pain.            Durable Medical Equipment  (From admission, onward)        Start     Ordered   06/16/17 0818  For home use only DME Walker rolling  Once    Comments:  5 inch wheels  Question:  Patient needs a walker to  treat with the following condition  Answer:  Weak   06/16/17 0818       DISCHARGE INSTRUCTIONS:   Follow with primary care physician in 1 week Follow-up with neurology in 3-4 weeks Follow-up with vascular surgery in 2 weeks, wound care by vascular surgery  DIET:  Cardiac diet  DISCHARGE CONDITION:  Stable  ACTIVITY:  Activity as tolerated  OXYGEN:  Home Oxygen: No.   Oxygen Delivery: room air  DISCHARGE LOCATION:  home   If you experience worsening of your admission symptoms, develop shortness of breath, life threatening emergency, suicidal or homicidal thoughts you must seek medical attention immediately by calling 911 or calling your MD immediately  if symptoms less severe.  You Must read complete instructions/literature along with all the possible adverse reactions/side effects for all the Medicines you take and that have been prescribed to you. Take any new Medicines after you have completely understood and accpet all the possible adverse reactions/side effects.   Please note  You were cared for by a hospitalist during your hospital stay. If you have any questions about your discharge medications or the care you received while you were in the hospital after you are discharged, you can call the unit and asked to speak with the hospitalist on call if the hospitalist that took care of you is not available. Once you are discharged, your primary care physician will handle any further medical issues. Please note that NO REFILLS for any discharge medications will be authorized once you are discharged, as it is imperative that you return to your primary care physician (or establish a relationship with a primary care physician if you do not have one) for your aftercare needs so that they can reassess your need for medications and monitor your lab values.     Today  Chief Complaint  Patient presents with  . Weakness    Patient is tolerating diet.  Pain is better controlled.   Wants to go home.  Okay to discharge patient from neurology and vascular surgery standpoint  ROS:  CONSTITUTIONAL: Denies fevers, chills. Denies any fatigue, weakness.  EYES: Denies blurry vision, double vision, eye pain. EARS, NOSE, THROAT: Denies tinnitus, ear pain, hearing loss. RESPIRATORY: Denies cough, wheeze, shortness of breath.  CARDIOVASCULAR: Denies chest pain, palpitations, edema.  GASTROINTESTINAL: Denies nausea, vomiting, diarrhea, abdominal pain. Denies bright red blood per rectum. GENITOURINARY: Denies dysuria, hematuria. ENDOCRINE: Denies nocturia or thyroid problems. HEMATOLOGIC AND LYMPHATIC: Denies easy bruising or bleeding. SKIN: Denies rash or lesion. MUSCULOSKELETAL: Denies pain in neck, back, shoulder, knees, hips or arthritic symptoms.  NEUROLOGIC: Denies paralysis, paresthesias.  PSYCHIATRIC: Denies anxiety or depressive symptoms.   VITAL SIGNS:  Blood pressure (!) 157/74, pulse 92, temperature 98.2 F (36.8 C), temperature source Oral, resp. rate 18, height 5\' 2"  (1.575 m), weight 71.3 kg (157 lb 1.6 oz), SpO2 99 %.  I/O:    Intake/Output Summary (Last 24 hours)  at 06/16/2017 1229 Last data filed at 06/16/2017 1004 Gross per 24 hour  Intake 840 ml  Output 400 ml  Net 440 ml    PHYSICAL EXAMINATION:  GENERAL:  57 y.o.-year-old patient lying in the bed with no acute distress.  EYES: Pupils equal, round, reactive to light and accommodation. No scleral icterus. Extraocular muscles intact.  HEENT: Head atraumatic, normocephalic. Oropharynx and nasopharynx clear.  NECK:  Supple, no jugular venous distention. No thyroid enlargement, no tenderness.  Left carotid endarterectomy scar clean LUNGS: Normal breath sounds bilaterally, no wheezing, rales,rhonchi or crepitation. No use of accessory muscles of respiration.  CARDIOVASCULAR: S1, S2 normal. No murmurs, rubs, or gallops.  ABDOMEN: Soft, non-tender, non-distended. Bowel sounds present. No organomegaly or  mass.  EXTREMITIES: No pedal edema, cyanosis, or clubbing.  NEUROLOGIC: Cranial nerves II through XII are intact. Muscle strength 5/5 in all extremities. Sensation intact. Gait not checked.  PSYCHIATRIC: The patient is alert and oriented x 3.  SKIN: No obvious rash, lesion, or ulcer.   DATA REVIEW:   CBC Recent Labs  Lab 06/15/17 0402  WBC 10.2  HGB 12.7  HCT 37.8  PLT 274    Chemistries  Recent Labs  Lab 06/11/17 1812  06/15/17 0402  NA 137   < > 137  K 4.8   < > 4.2  CL 105   < > 108  CO2 23   < > 22  GLUCOSE 119*   < > 151*  BUN 16   < > 15  CREATININE 1.24*   < > 0.98  CALCIUM 9.7   < > 8.5*  AST 31  --   --   ALT 18  --   --   ALKPHOS 90  --   --   BILITOT 1.1  --   --    < > = values in this interval not displayed.    Cardiac Enzymes No results for input(s): TROPONINI in the last 168 hours.  Microbiology Results  Results for orders placed or performed during the hospital encounter of 06/11/17  MRSA PCR Screening     Status: None   Collection Time: 06/14/17  1:13 PM  Result Value Ref Range Status   MRSA by PCR NEGATIVE NEGATIVE Final    Comment:        The GeneXpert MRSA Assay (FDA approved for NASAL specimens only), is one component of a comprehensive MRSA colonization surveillance program. It is not intended to diagnose MRSA infection nor to guide or monitor treatment for MRSA infections. Performed at [facility], [address], [place], [place] [address]     RADIOLOGY:  No results found.  EKG:   Orders placed or performed during the hospital encounter of 06/11/17  . ED EKG  . ED EKG  . EKG 12-Lead  . EKG 12-Lead      Management plans discussed with the patient, family and they are in agreement.  CODE STATUS:     Code Status Orders  (From admission, onward)        Start     Ordered   06/14/17 1300  Full code  Continuous     06/14/17 1259    Code Status History    Date Active Date Inactive Code Status Order ID  Comments User Context   06/11/2017 20:57 06/14/2017 12:59 Full Code [id number]  Fritzi Mandes, MD Inpatient      TOTAL TIME TAKING CARE OF THIS PATIENT: 45  minutes.   Note: This dictation  was prepared with Dragon dictation along with smaller phrase technology. Any transcriptional errors that result from this process are unintentional.   @[facility] @  on 06/16/2017 at 12:29 PM  Between 7am to 6pm - Pager - [phone]  After 6pm go to [email or web address] - password [email or web address] [facility]  [facility]  Office  [phone]  CC: Primary care physician; Albina Billet, MD

## 2017-06-16 NOTE — Discharge Instructions (Signed)
Follow with primary care physician in 1 week Follow-up with neurology in 3-4 weeks Follow-up with vascular surgery in 2 weeks, wound care by vascular surgery

## 2017-06-16 NOTE — Progress Notes (Signed)
Discharged with husband to home.  She will have either home PT or outpatient PT.  They will decide when they know the costs of each.  She has a walker at home and a neighbor who is an Therapist, sports and will size it for her.

## 2017-06-16 NOTE — Care Management Note (Signed)
Case Management Note  Patient Details  Name: Ariatna Jester MRN: [id number] Date of Birth: Oct 30, 1960  Subjective/Objective:     Discussed discharge planning with Ms Ledell Noss. She has a RW at home. She was provided with a form containing instructions about how to contact the [facility] for Outpatient PT. Husband at bedside for transport home.                Action/Plan:   Expected Discharge Date:  06/16/17               Expected Discharge Plan:     In-House Referral:     Discharge planning Services     Post Acute Care Choice:    Choice offered to:     DME Arranged:    DME Agency:     HH Arranged:    HH Agency:     Status of Service:     If discussed at [employer], dates discussed:    Additional Comments:  Kaelob Persky A, RN 06/16/2017, 9:40 AM

## 2017-06-16 NOTE — Progress Notes (Signed)
2 Days Post-Op   Subjective/Chief Complaint: Doing OK. Without complaint. Feels she is getting stronger. Worked with PT yesterday. Denies Pain.   Objective: Vital signs in last 24 hours: Temp:  [97.4 F (36.3 C)-98.2 F (36.8 C)] 98.2 F (36.8 C) (01/13 0805) Pulse Rate:  [92-110] 92 (01/13 0805) Resp:  [12-22] 18 (01/13 0805) BP: (114-169)/(41-81) 157/74 (01/13 0805) SpO2:  [97 %-99 %] 99 % (01/13 0805) Weight:  [71.3 kg (157 lb 1.6 oz)] 71.3 kg (157 lb 1.6 oz) (01/12 1436) Last BM Date: 06/13/17  Intake/Output from previous day: 01/12 0701 - 01/13 0700 In: 948 [P.O.:598; I.V.:300; IV Piggyback:50] Out: 12 [Urine:610] Intake/Output this shift: Total I/O In: 480 [P.O.:480] Out: -   Gen: Alert, NAD CV: RR Let neck incision: C/D/I, soft Neuro: grossly intact,strength right  4/5.  Lab Results:  Recent Labs    06/14/17 0506 06/15/17 0402  WBC 7.3 10.2  HGB 13.2 12.7  HCT 39.5 37.8  PLT 262 274   BMET Recent Labs    06/14/17 0506 06/15/17 0402  NA 138 137  K 3.8 4.2  CL 106 108  CO2 23 22  GLUCOSE 102* 151*  BUN 19 15  CREATININE 0.93 0.98  CALCIUM 8.8* 8.5*   PT/INR No results for input(s): LABPROT, INR in the last 72 hours. ABG No results for input(s): PHART, HCO3 in the last 72 hours.  Invalid input(s): PCO2, PO2  Studies/Results: No results found.  Anti-infectives: Anti-infectives (From admission, onward)   Start     Dose/Rate Route Frequency Ordered Stop   06/14/17 1600  ceFAZolin (ANCEF) IVPB 1 g/50 mL premix  Status:  Discontinued     1 g 100 mL/hr over 30 Minutes Intravenous Every 8 hours 06/14/17 1259 06/14/17 1308   06/14/17 1600  ceFAZolin (ANCEF) 1 g in dextrose 5 % 50 mL IVPB     1 g 100 mL/hr over 30 Minutes Intravenous Every 8 hours 06/14/17 1308 06/15/17 0912   06/14/17 0000  ceFAZolin (ANCEF) IVPB 1 g/50 mL premix    Comments:  Send with pt to OR   1 g 100 mL/hr over 30 Minutes Intravenous On call 06/13/17 1912 06/14/17  0846      Assessment/Plan: s/p Procedure(s): ENDARTERECTOMY CAROTID (Left) S/P Left CEA OK to D/C home with PT Continue ASA Place on Statin, BP control Follow Up with Vascular in 2 weeks as scheduled.   LOS: 5 days    Evaristo Bury 06/16/2017

## 2017-06-16 NOTE — Anesthesia Postprocedure Evaluation (Signed)
Anesthesia Post Note  Patient: Jennifer Oconnor  Procedure(s) Performed: ENDARTERECTOMY CAROTID (Left )  Patient location during evaluation: SICU Anesthesia Type: General Level of consciousness: awake Pain management: pain level controlled Vital Signs Assessment: post-procedure vital signs reviewed and stable Respiratory status: spontaneous breathing, nonlabored ventilation and respiratory function stable Cardiovascular status: stable Postop Assessment: no apparent nausea or vomiting Anesthetic complications: no     Last Vitals:  Vitals:   06/15/17 1932 06/16/17 0500  BP: (!) 156/81 128/72  Pulse: (!) 103   Resp: 17 19  Temp: 36.8 C 36.7 C  SpO2: 98% 98%    Last Pain:  Vitals:   06/16/17 0500  TempSrc: Oral  PainSc:                  Martha Clan

## 2017-06-17 ENCOUNTER — Encounter: Payer: Self-pay | Admitting: Vascular Surgery

## 2017-06-17 LAB — SURGICAL PATHOLOGY

## 2017-06-17 LAB — FACTOR 5 LEIDEN

## 2017-07-05 ENCOUNTER — Other Ambulatory Visit (INDEPENDENT_AMBULATORY_CARE_PROVIDER_SITE_OTHER): Payer: Self-pay | Admitting: Vascular Surgery

## 2017-07-05 DIAGNOSIS — I779 Disorder of arteries and arterioles, unspecified: Secondary | ICD-10-CM

## 2017-07-05 DIAGNOSIS — I739 Peripheral vascular disease, unspecified: Principal | ICD-10-CM

## 2017-07-11 ENCOUNTER — Ambulatory Visit (INDEPENDENT_AMBULATORY_CARE_PROVIDER_SITE_OTHER): Payer: Self-pay | Admitting: Vascular Surgery

## 2017-07-11 ENCOUNTER — Encounter (INDEPENDENT_AMBULATORY_CARE_PROVIDER_SITE_OTHER): Payer: Self-pay | Admitting: Vascular Surgery

## 2017-07-11 ENCOUNTER — Ambulatory Visit (INDEPENDENT_AMBULATORY_CARE_PROVIDER_SITE_OTHER): Payer: Self-pay

## 2017-07-11 VITALS — BP 157/70 | HR 77 | Resp 17 | Ht 61.0 in | Wt 152.2 lb

## 2017-07-11 DIAGNOSIS — I739 Peripheral vascular disease, unspecified: Principal | ICD-10-CM

## 2017-07-11 DIAGNOSIS — I6350 Cerebral infarction due to unspecified occlusion or stenosis of unspecified cerebral artery: Secondary | ICD-10-CM

## 2017-07-11 DIAGNOSIS — F419 Anxiety disorder, unspecified: Secondary | ICD-10-CM

## 2017-07-11 DIAGNOSIS — I6523 Occlusion and stenosis of bilateral carotid arteries: Secondary | ICD-10-CM

## 2017-07-11 DIAGNOSIS — I779 Disorder of arteries and arterioles, unspecified: Secondary | ICD-10-CM

## 2017-07-11 DIAGNOSIS — I6529 Occlusion and stenosis of unspecified carotid artery: Secondary | ICD-10-CM | POA: Insufficient documentation

## 2017-07-11 NOTE — Progress Notes (Signed)
Patient ID: Jennifer Oconnor, female   DOB: Feb 13, 1961, 57 y.o.   MRN: [id number]  Chief Complaint  Patient presents with  . Routine Post Op    [facility] 3 wk Carotid duplex    HPI Jennifer Oconnor is a 57 y.o. female.  She is s/p left CEA on 06/14/2017.  No periprocedural complication.  No c/o today   Past Medical History:  Diagnosis Date  . Anxiety   . Diverticulitis    in past  . Hypercholesteremia     Past Surgical History:  Procedure Laterality Date  . ABDOMINAL HYSTERECTOMY  1993  . COLON SURGERY     colectomy 9/11  . COLONOSCOPY N/A 10/15/2014   Procedure: COLONOSCOPY;  Surgeon: Lucilla Lame, MD;  Location: [facility];  Service: Gastroenterology;  Laterality: N/A;  cecum time- 1003  . ENDARTERECTOMY Left 06/14/2017   Procedure: ENDARTERECTOMY CAROTID;  Surgeon: Katha Cabal, MD;  Location: [facility];  Service: Vascular;  Laterality: Left;  . PILONIDAL CYST DRAINAGE  02/13/12  . POLYPECTOMY  10/15/2014   Procedure: POLYPECTOMY INTESTINAL;  Surgeon: Lucilla Lame, MD;  Location: [place];  Service: Gastroenterology;;      No Known Allergies  Current Outpatient Medications  Medication Sig Dispense Refill  . acetaminophen (TYLENOL) 325 MG tablet Take 1-2 tablets (325-650 mg total) by mouth every 4 (four) hours as needed for mild pain (or temp >/= 101 F).    . ALPRAZolam (XANAX) 0.5 MG tablet Take 0.5 mg by mouth 2 (two) times daily as needed for anxiety.    Marland Kitchen aspirin EC 81 MG EC tablet Take 1 tablet (81 mg total) by mouth daily.    Marland Kitchen atorvastatin (LIPITOR) 80 MG tablet Take 1 tablet (80 mg total) by mouth daily at 6 PM. 30 tablet 0  . buPROPion (WELLBUTRIN XL) 300 MG 24 hr tablet Take 300 mg by mouth daily. AM    . estradiol (VIVELLE-DOT) 0.025 MG/24HR Place 1 patch onto the skin 2 (two) times a week. Wed/Sun    . lisinopril (PRINIVIL,ZESTRIL) 5 MG tablet TK 1 T PO QD  4  . oxyCODONE (OXY IR/ROXICODONE) 5 MG immediate release tablet Take 1-2  tablets (5-10 mg total) by mouth every 4 (four) hours as needed for moderate pain. 30 tablet 0  . clopidogrel (PLAVIX) 75 MG tablet Take 1 tablet (75 mg total) by mouth daily. (Patient not taking: Reported on 07/11/2017) 30 tablet 0   No current facility-administered medications for this visit.         Physical Exam BP (!) 157/70 (BP Location: Left Arm)   Pulse 77   Resp 17   Ht 5\' 1"  (1.549 m)   Wt 152 lb 3.2 oz (69 kg)   BMI 28.76 kg/m  Gen:  WD/WN, NAD Skin: incision C/D/I, Neurologic exam is normal     Assessment/Plan: 1. Bilateral carotid artery stenosis Recommend:  The patient is s/p successful left CEA  Duplex ultrasound today shows 40-59%contralateral stenosis.  Left ICA is widely patent.  Continue antiplatelet therapy as prescribed Continue management of CAD, HTN and Hyperlipidemia Healthy heart diet,  encouraged exercise at least 4 times per week  Follow up in 6 months with duplex ultrasound and physical exam based on the patient's carotid surgery and 50% stenosis of the RICA carotid artery    2. Cerebrovascular accident (CVA) due to stenosis of cerebral artery (HCC) Continue antiplatelet meds as described  3. Anxiety Continue Xanax as already ordered, these medications  have been reviewed and there are no changes at this time.       Hortencia Pilar 07/11/2017, 11:38 AM   This note was created with Dragon medical transcription system.  Any errors from dictation are unintentional.

## 2017-08-01 DIAGNOSIS — I69328 Other speech and language deficits following cerebral infarction: Secondary | ICD-10-CM | POA: Insufficient documentation

## 2017-08-23 NOTE — ED Provider Notes (Signed)
EKG done on 06/11/16 read and interpreted by me shows sinus tachycardia rate of 103 normal axis nonspecific ST-T changes   Nena Polio, MD 08/23/17 1555

## 2017-09-18 ENCOUNTER — Encounter (INDEPENDENT_AMBULATORY_CARE_PROVIDER_SITE_OTHER): Payer: Self-pay

## 2018-01-08 ENCOUNTER — Other Ambulatory Visit (INDEPENDENT_AMBULATORY_CARE_PROVIDER_SITE_OTHER): Payer: Self-pay | Admitting: Vascular Surgery

## 2018-01-08 DIAGNOSIS — I679 Cerebrovascular disease, unspecified: Secondary | ICD-10-CM

## 2018-01-09 ENCOUNTER — Encounter (INDEPENDENT_AMBULATORY_CARE_PROVIDER_SITE_OTHER): Payer: Self-pay | Admitting: Vascular Surgery

## 2018-01-09 ENCOUNTER — Ambulatory Visit (INDEPENDENT_AMBULATORY_CARE_PROVIDER_SITE_OTHER): Payer: Self-pay | Admitting: Vascular Surgery

## 2018-01-09 ENCOUNTER — Ambulatory Visit (INDEPENDENT_AMBULATORY_CARE_PROVIDER_SITE_OTHER): Payer: Self-pay

## 2018-01-09 VITALS — BP 144/77 | HR 79 | Resp 12 | Ht 60.0 in | Wt 146.0 lb

## 2018-01-09 DIAGNOSIS — I679 Cerebrovascular disease, unspecified: Secondary | ICD-10-CM

## 2018-01-09 DIAGNOSIS — E782 Mixed hyperlipidemia: Secondary | ICD-10-CM

## 2018-01-09 DIAGNOSIS — I10 Essential (primary) hypertension: Secondary | ICD-10-CM

## 2018-01-09 DIAGNOSIS — I6523 Occlusion and stenosis of bilateral carotid arteries: Secondary | ICD-10-CM

## 2018-01-12 ENCOUNTER — Encounter (INDEPENDENT_AMBULATORY_CARE_PROVIDER_SITE_OTHER): Payer: Self-pay | Admitting: Vascular Surgery

## 2018-01-12 DIAGNOSIS — E785 Hyperlipidemia, unspecified: Secondary | ICD-10-CM | POA: Insufficient documentation

## 2018-01-12 DIAGNOSIS — I10 Essential (primary) hypertension: Secondary | ICD-10-CM | POA: Insufficient documentation

## 2018-01-12 NOTE — Progress Notes (Signed)
MRN : [id number]  Jennifer Oconnor is a 57 y.o. (Mar 17, 1961) female who presents with chief complaint of  Chief Complaint  Patient presents with  . Carotid    6 month Carotid follow up  .  History of Present Illness: The patient is seen for follow up evaluation of carotid stenosis. She is s/p left CEA on 06/14/2017.  No periprocedural complication. The carotid stenosis followed by ultrasound.   The patient denies amaurosis fugax. There is no recent history of TIA symptoms or focal motor deficits. There is no prior documented CVA.  The patient is taking enteric-coated aspirin 81 mg daily.  There is no history of migraine headaches. There is no history of seizures.  The patient has a history of coronary artery disease, no recent episodes of angina or shortness of breath. The patient denies PAD or claudication symptoms. There is a history of hyperlipidemia which is being treated with a statin.    Carotid Duplex done today shows RICA 81-44% and LICA widely patent s/p CEA.  No change compared to last study   Current Meds  Medication Sig  . ALPRAZolam (XANAX) 0.5 MG tablet Take 0.5 mg by mouth 2 (two) times daily as needed for anxiety.  Marland Kitchen aspirin EC 81 MG EC tablet Take 1 tablet (81 mg total) by mouth daily.  Marland Kitchen atorvastatin (LIPITOR) 80 MG tablet Take 1 tablet (80 mg total) by mouth daily at 6 PM.  . buPROPion (WELLBUTRIN XL) 300 MG 24 hr tablet Take 300 mg by mouth daily. AM  . estradiol (VIVELLE-DOT) 0.025 MG/24HR Place 1 patch onto the skin 2 (two) times a week. Wed/Sun  . lisinopril (PRINIVIL,ZESTRIL) 5 MG tablet TK 1 T PO QD  . sertraline (ZOLOFT) 25 MG tablet Take 25 mg (1 tablet) every morning for one week then increase to 50 mg (two tablets) every morning.  . [DISCONTINUED] clopidogrel (PLAVIX) 75 MG tablet Take 1 tablet (75 mg total) by mouth daily.    Past Medical History:  Diagnosis Date  . Anxiety   . Diverticulitis    in past  . Hypercholesteremia     Past  Surgical History:  Procedure Laterality Date  . ABDOMINAL HYSTERECTOMY  1993  . COLON SURGERY     colectomy 9/11  . COLONOSCOPY N/A 10/15/2014   Procedure: COLONOSCOPY;  Surgeon: Lucilla Lame, MD;  Location: [place];  Service: Gastroenterology;  Laterality: N/A;  cecum time- 1003  . ENDARTERECTOMY Left 06/14/2017   Procedure: ENDARTERECTOMY CAROTID;  Surgeon: Katha Cabal, MD;  Location: [facility];  Service: Vascular;  Laterality: Left;  . PILONIDAL CYST DRAINAGE  02/13/12  . POLYPECTOMY  10/15/2014   Procedure: POLYPECTOMY INTESTINAL;  Surgeon: Lucilla Lame, MD;  Location: [place];  Service: Gastroenterology;;    Social History Social History   Tobacco Use  . Smoking status: Former Smoker    Packs/day: 0.50    Years: 40.00    Pack years: 20.00  . Smokeless tobacco: Never Used  Substance Use Topics  . Alcohol use: No  . Drug use: No    Family History Family History  Problem Relation Age of Onset  . Heart attack Mother   . Alzheimer's disease Father     No Known Allergies   REVIEW OF SYSTEMS (Negative unless checked)  Constitutional: [] Weight loss  [] Fever  [] Chills Cardiac: [] Chest pain   [] Chest pressure   [] Palpitations   [] Shortness of breath when laying flat   [] Shortness of breath with exertion.  Vascular:  [] Pain in legs with walking   [] Pain in legs at rest  [] History of DVT   [] Phlebitis   [] Swelling in legs   [] Varicose veins   [] Non-healing ulcers Pulmonary:   [] Uses home oxygen   [] Productive cough   [] Hemoptysis   [] Wheeze  [] COPD   [] Asthma Neurologic:  [] Dizziness   [] Seizures   [] History of stroke   [] History of TIA  [] Aphasia   [] Vissual changes   [] Weakness or numbness in arm   [] Weakness or numbness in leg Musculoskeletal:   [] Joint swelling   [] Joint pain   [] Low back pain Hematologic:  [] Easy bruising  [] Easy bleeding   [] Hypercoagulable state   [] Anemic Gastrointestinal:  [] Diarrhea   [] Vomiting  [] Gastroesophageal  reflux/heartburn   [] Difficulty swallowing. Genitourinary:  [] Chronic kidney disease   [] Difficult urination  [] Frequent urination   [] Blood in urine Skin:  [] Rashes   [] Ulcers  Psychological:  [] History of anxiety   []  History of major depression.  Physical Examination  Vitals:   01/09/18 1148 01/09/18 1149  BP: (!) 159/81 (!) 144/77  Pulse: 77 79  Resp: 12   Weight: 146 lb (66.2 kg)   Height: 5' (1.524 m)    Body mass index is 28.51 kg/m. Gen: WD/WN, NAD Head: [place]/AT, No temporalis wasting.  Ear/Nose/Throat: Hearing grossly intact, nares w/o erythema or drainage Eyes: PER, EOMI, sclera nonicteric.  Neck: Supple, no large masses.   Pulmonary:  Good air movement, no audible wheezing bilaterally, no use of accessory muscles.  Cardiac: RRR, no JVD Vascular: right carotid bruit; left well healed CEA incisional scar Vessel Right Left  Radial Palpable Palpable  Brachial Palpable Palpable  Carotid Palpable Palpable  Gastrointestinal: Non-distended. No guarding/no peritoneal signs.  Musculoskeletal: M/S 5/5 throughout.  No deformity or atrophy.  Neurologic: CN 2-12 intact. Symmetrical.  Speech is fluent. Motor exam as listed above. Psychiatric: Judgment intact, Mood & affect appropriate for pt's clinical situation. Dermatologic: No rashes or ulcers noted.  No changes consistent with cellulitis. Lymph : No lichenification or skin changes of chronic lymphedema.  CBC Lab Results  Component Value Date   WBC 10.2 06/15/2017   HGB 12.7 06/15/2017   HCT 37.8 06/15/2017   MCV 95.2 06/15/2017   PLT 274 06/15/2017    BMET    Component Value Date/Time   NA 137 06/15/2017 0402   K 4.2 06/15/2017 0402   CL 108 06/15/2017 0402   CO2 22 06/15/2017 0402   GLUCOSE 151 (H) 06/15/2017 0402   BUN 15 06/15/2017 0402   CREATININE 0.98 06/15/2017 0402   CALCIUM 8.5 (L) 06/15/2017 0402   GFRNONAA >60 06/15/2017 0402   GFRAA >60 06/15/2017 0402   CrCl cannot be calculated (Patient's most  recent lab result is older than the maximum 21 days allowed.).  COAG No results found for: INR, PROTIME  Radiology No results found.   Assessment/Plan 1. Bilateral carotid artery stenosis Recommend:  Given the patient's asymptomatic subcritical stenosis no further invasive testing or surgery at this time.  Duplex ultrasound shows 40-59% right and widley patent left internal carotid .  Continue antiplatelet therapy as prescribed Continue management of CAD, HTN and Hyperlipidemia Healthy heart diet,  encouraged exercise at least 4 times per week Follow up in 12 months with duplex ultrasound and physical exam   - VAS US CAROTID; Future  2. Essential hypertension Continue antihypertensive medications as already ordered, these medications have been reviewed and there are no changes at this time.   3.  Mixed hyperlipidemia Continue statin as ordered and reviewed, no changes at this time     Hortencia Pilar, MD  01/12/2018 3:23 PM

## 2018-07-06 IMAGING — US US CAROTID DUPLEX BILAT
1 series · 13 of 24 positions shown · non-contrast
Comparison: 03/26/2002 by report only

CLINICAL DATA: Stroke.  Memory loss.

EXAM:
BILATERAL CAROTID DUPLEX ULTRASOUND
TECHNIQUE: Gray scale imaging, color Doppler and duplex ultrasound was
performed of bilateral carotid and vertebral arteries in the neck.
TECHNIQUE: Quantification of carotid stenosis is based on velocity parameters
that correlate the residual internal carotid diameter with
NASCET-based stenosis levels, using the diameter of the distal
internal carotid lumen as the denominator for stenosis measurement.

[Series 1: us carotid duplex bilat · 13 of 65 slices shown]
[im 1/65]
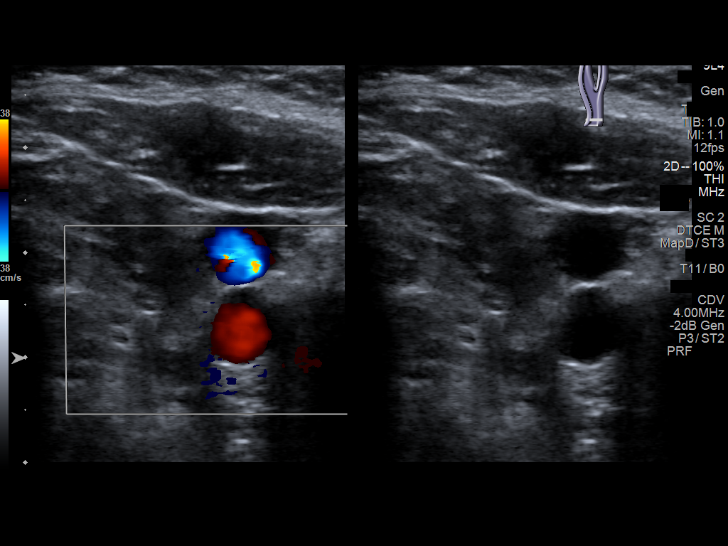
[im 6/65]
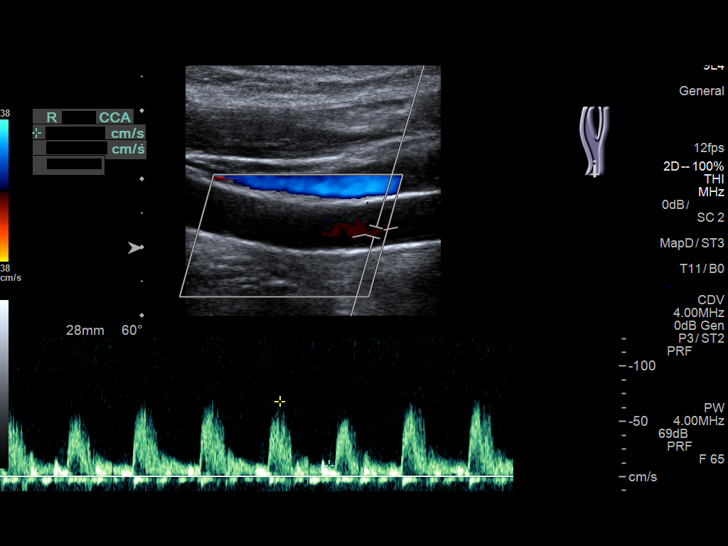
[im 12/65]
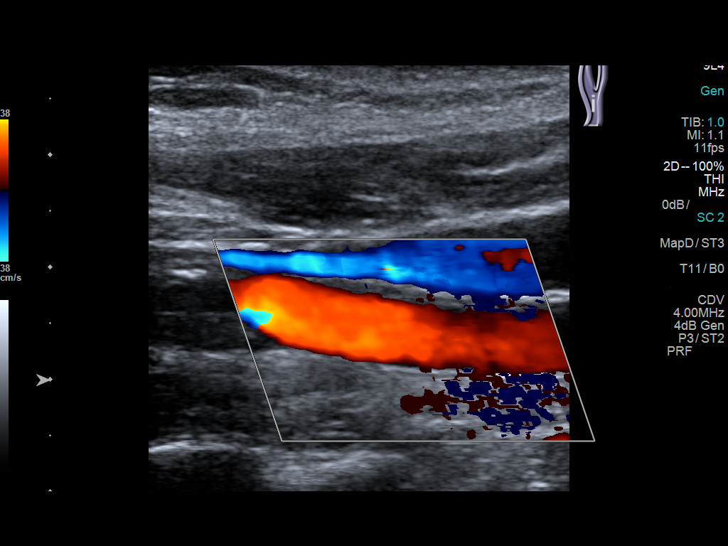
[im 17/65]
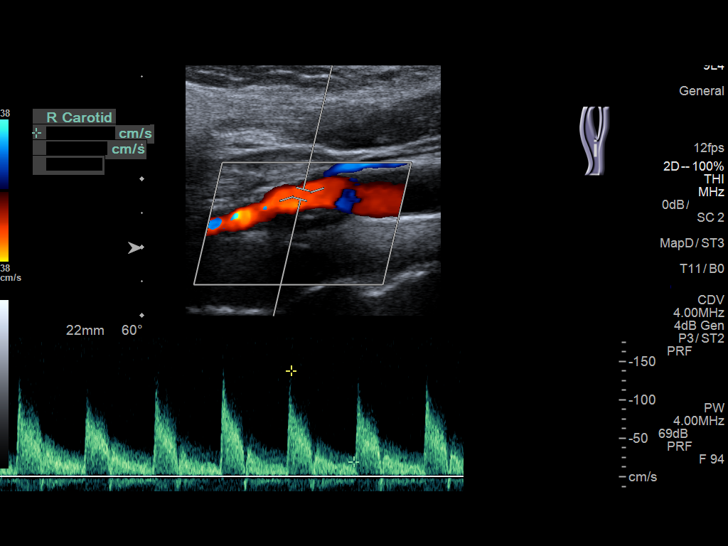
[im 23/65]
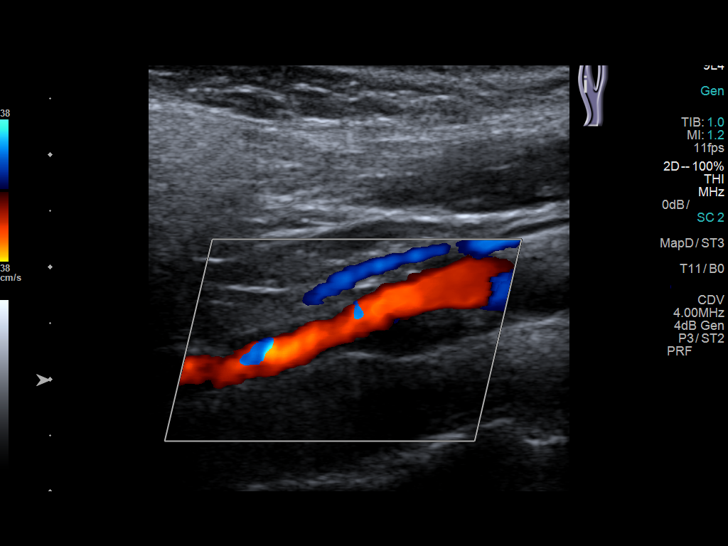
[im 28/65]
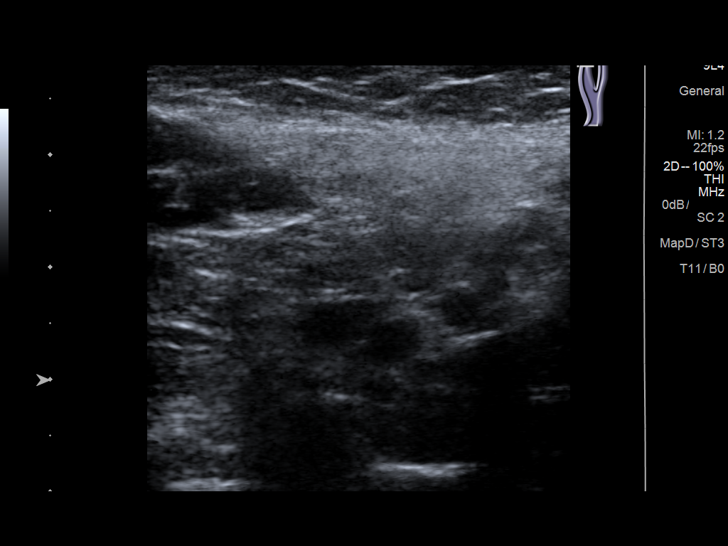
[im 34/65]
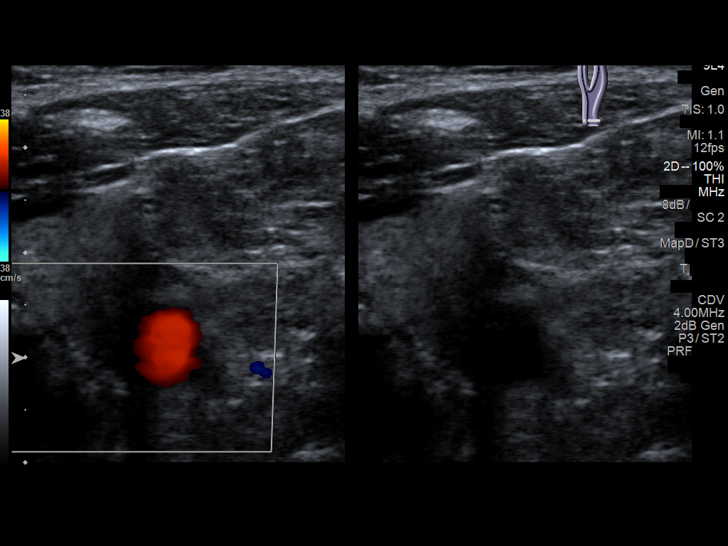
[im 37/65]
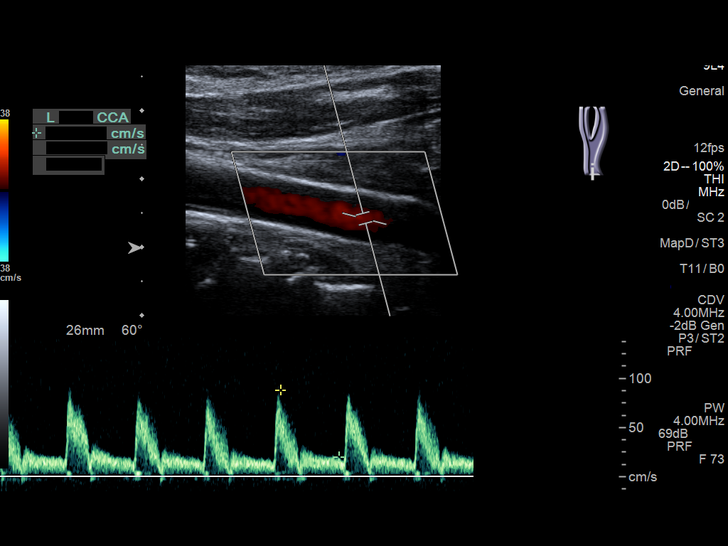
[im 42/65]
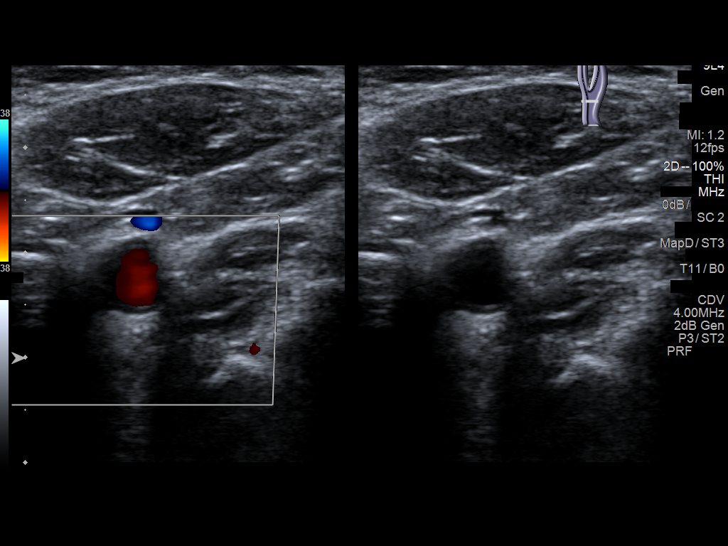
[im 48/65]
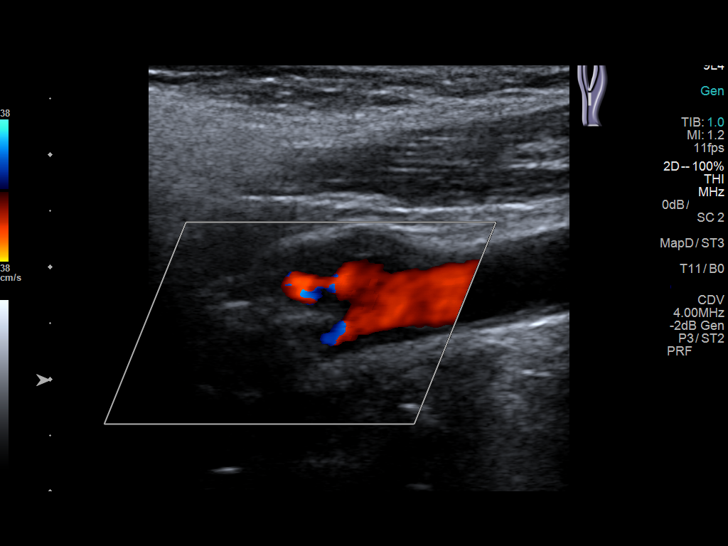
[im 53/65]
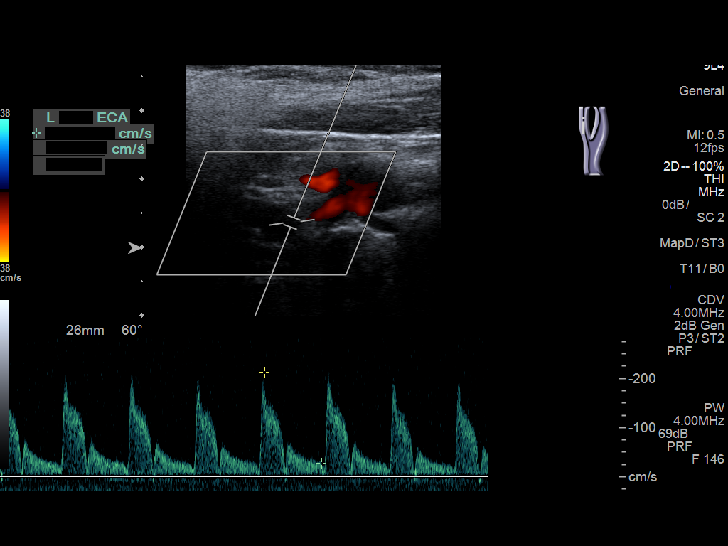
[im 59/65]
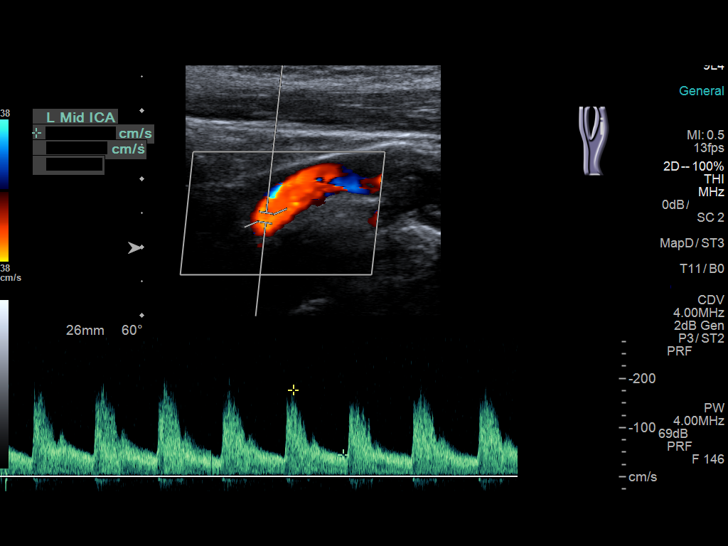
[im 65/65]
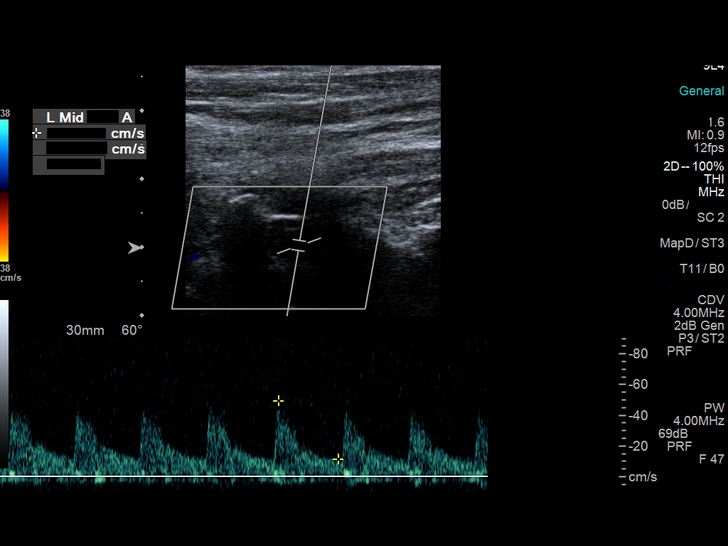

[13 of 24 positions shown; findings below may reference images not displayed]

The following velocity measurements were obtained:

PEAK SYSTOLIC/END DIASTOLIC

RIGHT

ICA:                     178/52cm/sec

CCA:                     93/23cm/sec

SYSTOLIC ICA/CCA RATIO:

DIASTOLIC ICA/CCA RATIO:

ECA:                     160cm/sec

LEFT

ICA:                     253/41cm/sec

CCA:                     106/23cm/sec

SYSTOLIC ICA/CCA RATIO:

DIASTOLIC ICA/CCA RATIO:

ECA:                     213cm/sec
FINDINGS: RIGHT CAROTID ARTERY: Eccentric noncalcified plaque in the distal
common carotid artery and bulb extending into the proximal ICA. No
high-grade stenosis. Normal waveforms and color Doppler signal.

RIGHT VERTEBRAL ARTERY:  Normal flow direction and waveform.

LEFT CAROTID ARTERY: Eccentric noncalcified plaque in the carotid
bulb extending into proximal internal and external carotid arteries.
Focal stenosis in the proximal ICA with elevated peak systolic
velocities just distally. Normal color Doppler signal and waveforms
otherwise.

LEFT VERTEBRAL ARTERY: Normal flow direction and waveform.
IMPRESSION: 1. Bilateral carotid bifurcation and proximal ICA plaque, resulting
in less than 50% diameter stenosis on the right, 50-69% diameter
proximal ICA stenosis on the left. This represents progression of
disease on the left since previous examination.
2.  Antegrade bilateral vertebral arterial flow.

## 2018-08-15 IMAGING — CT CT ANGIO NECK
2 of 7 series · 8 of 33 positions shown · IV contrast (APPLIED)
Comparison: MRI head 06/11/2017

CLINICAL DATA: Stroke

EXAM:
CT ANGIOGRAPHY NECK
TECHNIQUE: Multidetector CT imaging of the neck was performed using the
standard protocol during bolus administration of intravenous
contrast. Multiplanar CT image reconstructions and MIPs were
obtained to evaluate the vascular anatomy. Carotid stenosis
measurements (when applicable) are obtained utilizing NASCET
criteria, using the distal internal carotid diameter as the
denominator.
CONTRAST:  75mL ETYGCV-057 IOPAMIDOL (ETYGCV-057) INJECTION 76%

[Series 4: cta neck · axial · 0.51mm/px · z∈[+1080,+1152]mm · 2 of 110 slices shown]
[im 37/110  soft-tissue]
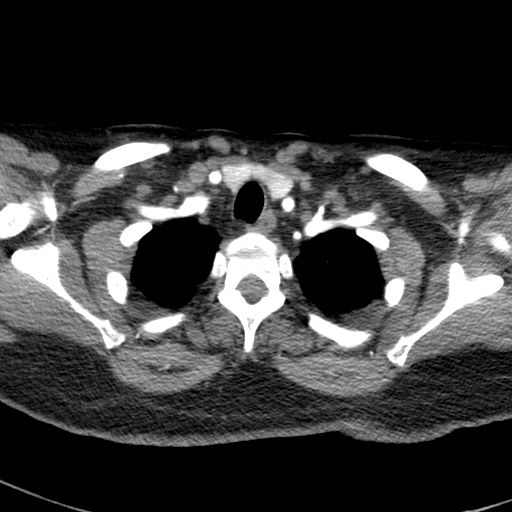
[im 73/110  soft-tissue]
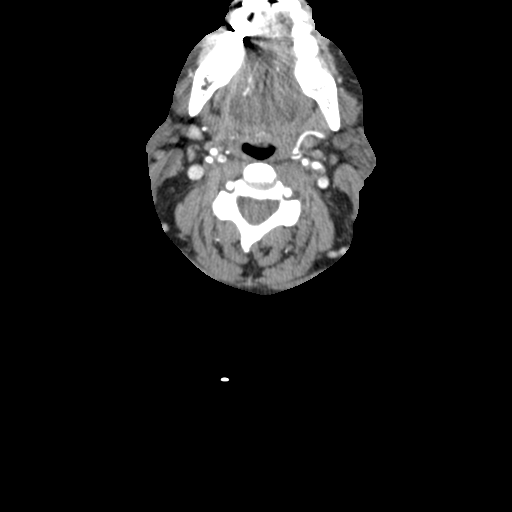

[Series 6: ax thin · axial · 0.51mm/px · z∈[+1038,+1189]mm · 6 of 213 slices shown]
[im 31/213  soft-tissue]
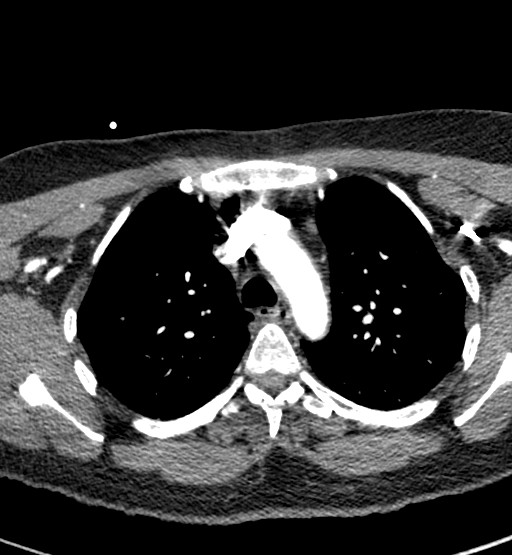
[im 61/213  bone]
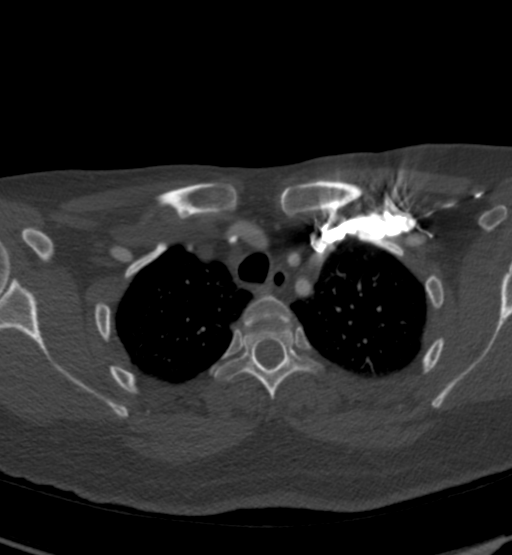
[im 91/213  soft-tissue]
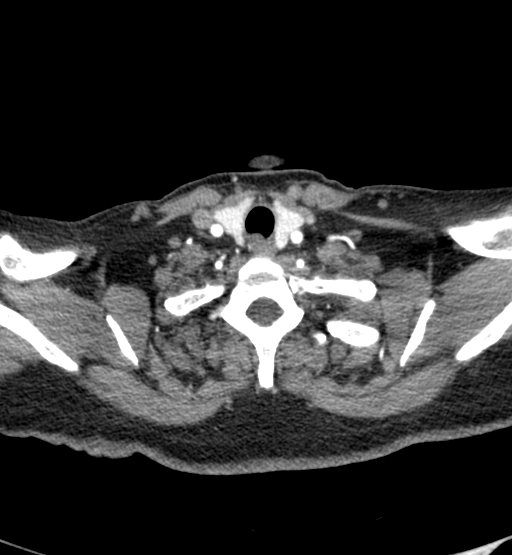
[im 122/213  bone]
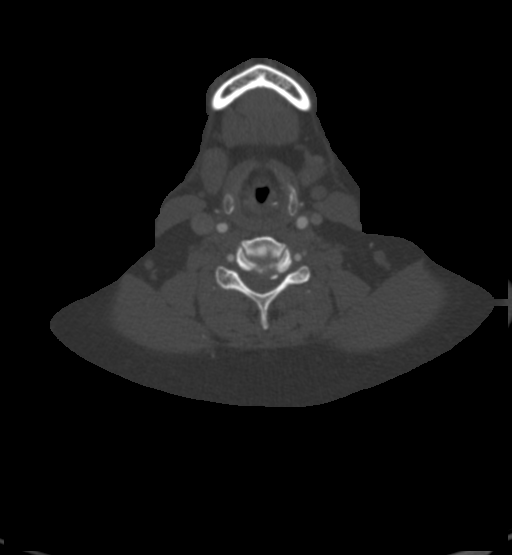
[im 152/213  soft-tissue]
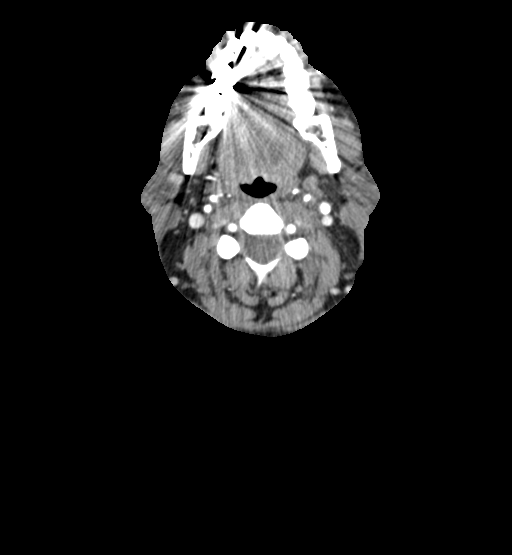
[im 182/213  bone]
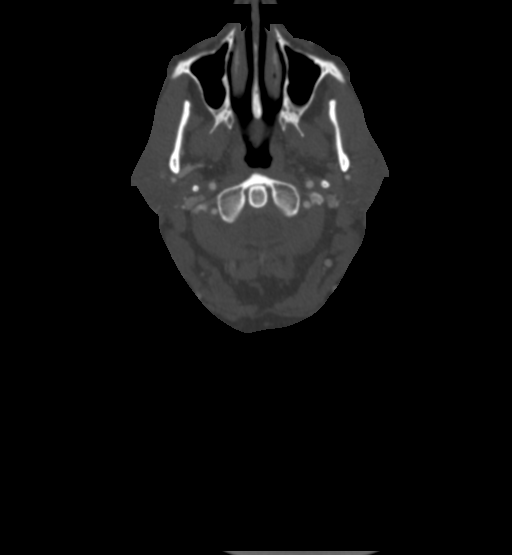

[8 of 33 positions shown; findings below may reference images not displayed]

FINDINGS: Aortic arch: Mild atherosclerotic disease aortic arch and proximal
great vessels. Mild stenosis origin left subclavian artery.

Right carotid system: Right common carotid artery widely patent.
Atherosclerotic plaque in the right carotid bulb which is
noncalcified. Minimal luminal diameter 2.7 mm corresponding to 20%
diameter stenosis. No filling defect

Left carotid system: Left common carotid artery patent without
significant stenosis. Noncalcified plaque left carotid bulb..
Minimal luminal diameter 2.1 mm corresponding to 35% diameter
stenosis. Just above the stenosis, there is a small linear filling
defect within the lumen compatible with thrombus or intimal flap.
This may be a source of emboli from the left MCA stroke.

Vertebral arteries: Both vertebral arteries widely patent

Skeleton: Spondylosis with prominent spurring C5-6 causing spinal
stenosis. No acute skeletal abnormality.

Other neck: Negative

Upper chest: Negative
IMPRESSION: 20% diameter stenosis proximal right internal carotid artery

35% diameter stenosis proximal left internal carotid artery. Small
linear filling defect in the internal carotid artery most likely
thrombus or intimal flap and is a source of emboli causing left MCA
stroke.

Both vertebral arteries widely patent.

These results will be called to the ordering clinician or
representative by the Radiologist Assistant, and communication
documented in the PACS or zVision Dashboard.

## 2019-01-12 ENCOUNTER — Encounter (INDEPENDENT_AMBULATORY_CARE_PROVIDER_SITE_OTHER): Payer: Self-pay | Admitting: Vascular Surgery

## 2019-01-12 ENCOUNTER — Encounter (INDEPENDENT_AMBULATORY_CARE_PROVIDER_SITE_OTHER): Payer: Self-pay

## 2019-01-12 ENCOUNTER — Ambulatory Visit (INDEPENDENT_AMBULATORY_CARE_PROVIDER_SITE_OTHER): Payer: Self-pay

## 2019-01-12 ENCOUNTER — Ambulatory Visit (INDEPENDENT_AMBULATORY_CARE_PROVIDER_SITE_OTHER): Payer: Self-pay | Admitting: Vascular Surgery

## 2019-01-12 ENCOUNTER — Other Ambulatory Visit: Payer: Self-pay

## 2019-01-12 VITALS — BP 128/73 | HR 80 | Resp 10 | Ht 60.0 in | Wt 124.0 lb

## 2019-01-12 DIAGNOSIS — I10 Essential (primary) hypertension: Secondary | ICD-10-CM

## 2019-01-12 DIAGNOSIS — E782 Mixed hyperlipidemia: Secondary | ICD-10-CM

## 2019-01-12 DIAGNOSIS — I6523 Occlusion and stenosis of bilateral carotid arteries: Secondary | ICD-10-CM

## 2019-01-12 NOTE — Progress Notes (Signed)
MRN : [id number]  Jennifer Oconnor is a 58 y.o. (07/30/1960) female who presents with chief complaint of No chief complaint on file. Marland Kitchen  History of Present Illness:   The patient is seen for follow up evaluation of carotid stenosis. She is s/p left CEA on 06/14/2017. No periprocedural complication. The carotid stenosis followed by ultrasound.   The patient denies amaurosis fugax. There is no recent history of TIA symptoms or focal motor deficits. There is no prior documented CVA.  The patient is taking enteric-coated aspirin 81 mg daily.  There is no history of migraine headaches. There is no history of seizures.  The patient has a history of coronary artery disease, no recent episodes of angina or shortness of breath. The patient denies PAD or claudication symptoms. There is a history of hyperlipidemia which is being treated with a statin.   Carotid Duplex done today shows RICA 40-98% and LICA widely patent s/p CEA.  No change compared to last study  No outpatient medications have been marked as taking for the 01/12/19 encounter (Appointment) with Delana Meyer, Dolores Lory, MD.    Past Medical History:  Diagnosis Date  . Anxiety   . Diverticulitis    in past  . Hypercholesteremia     Past Surgical History:  Procedure Laterality Date  . ABDOMINAL HYSTERECTOMY  1993  . COLON SURGERY     colectomy 9/11  . COLONOSCOPY N/A 10/15/2014   Procedure: COLONOSCOPY;  Surgeon: Lucilla Lame, MD;  Location: [place];  Service: Gastroenterology;  Laterality: N/A;  cecum time- 1003  . ENDARTERECTOMY Left 06/14/2017   Procedure: ENDARTERECTOMY CAROTID;  Surgeon: Katha Cabal, MD;  Location: [facility];  Service: Vascular;  Laterality: Left;  . PILONIDAL CYST DRAINAGE  02/13/12  . POLYPECTOMY  10/15/2014   Procedure: POLYPECTOMY INTESTINAL;  Surgeon: Lucilla Lame, MD;  Location: [place];  Service: Gastroenterology;;    Social History Social History   Tobacco Use   . Smoking status: Former Smoker    Packs/day: 0.50    Years: 40.00    Pack years: 20.00  . Smokeless tobacco: Never Used  Substance Use Topics  . Alcohol use: No  . Drug use: No    Family History Family History  Problem Relation Age of Onset  . Heart attack Mother   . Alzheimer's disease Father     No Known Allergies   REVIEW OF SYSTEMS (Negative unless checked)  Constitutional: [] Weight loss  [] Fever  [] Chills Cardiac: [] Chest pain   [] Chest pressure   [] Palpitations   [] Shortness of breath when laying flat   [] Shortness of breath with exertion. Vascular:  [] Pain in legs with walking   [] Pain in legs at rest  [] History of DVT   [] Phlebitis   [] Swelling in legs   [] Varicose veins   [] Non-healing ulcers Pulmonary:   [] Uses home oxygen   [] Productive cough   [] Hemoptysis   [] Wheeze  [] COPD   [] Asthma Neurologic:  [] Dizziness   [] Seizures   [] History of stroke   [] History of TIA  [] Aphasia   [] Vissual changes   [] Weakness or numbness in arm   [] Weakness or numbness in leg Musculoskeletal:   [] Joint swelling   [] Joint pain   [] Low back pain Hematologic:  [] Easy bruising  [] Easy bleeding   [] Hypercoagulable state   [] Anemic Gastrointestinal:  [] Diarrhea   [] Vomiting  [] Gastroesophageal reflux/heartburn   [] Difficulty swallowing. Genitourinary:  [] Chronic kidney disease   [] Difficult urination  [] Frequent urination   [] Blood in  urine Skin:  [] Rashes   [] Ulcers  Psychological:  [] History of anxiety   []  History of major depression.  Physical Examination  There were no vitals filed for this visit. There is no height or weight on file to calculate BMI. Gen: WD/WN, NAD Head: /AT, No temporalis wasting.  Ear/Nose/Throat: Hearing grossly intact, nares w/o erythema or drainage Eyes: PER, EOMI, sclera nonicteric.  Neck: Supple, no large masses.   Pulmonary:  Good air movement, no audible wheezing bilaterally, no use of accessory muscles.  Cardiac: RRR, no JVD Vascular:  Well healed  left CEA incisional scar  No bruits noted Vessel Right Left  Radial Palpable Palpable  Carotid Palpable Palpable  PT Palpable Palpable  DP Palpable Palpable  Gastrointestinal: Non-distended. No guarding/no peritoneal signs.  Musculoskeletal: M/S 5/5 throughout.  No deformity or atrophy.  Neurologic: CN 2-12 intact. Symmetrical.  Speech is fluent. Motor exam as listed above. Psychiatric: Judgment intact, Mood & affect appropriate for pt's clinical situation. Dermatologic: No rashes or ulcers noted.  No changes consistent with cellulitis. Lymph : No lichenification or skin changes of chronic lymphedema.  CBC Lab Results  Component Value Date   WBC 10.2 06/15/2017   HGB 12.7 06/15/2017   HCT 37.8 06/15/2017   MCV 95.2 06/15/2017   PLT 274 06/15/2017    BMET    Component Value Date/Time   NA 137 06/15/2017 0402   K 4.2 06/15/2017 0402   CL 108 06/15/2017 0402   CO2 22 06/15/2017 0402   GLUCOSE 151 (H) 06/15/2017 0402   BUN 15 06/15/2017 0402   CREATININE 0.98 06/15/2017 0402   CALCIUM 8.5 (L) 06/15/2017 0402   GFRNONAA >60 06/15/2017 0402   GFRAA >60 06/15/2017 0402   CrCl cannot be calculated (Patient's most recent lab result is older than the maximum 21 days allowed.).  COAG No results found for: INR, PROTIME  Radiology No results found.   Assessment/Plan 1. Bilateral carotid artery stenosis Recommend:  Given the patient's asymptomatic subcritical stenosis no further invasive testing or surgery at this time.  Duplex ultrasound shows 40-59% right and widley patent left internal carotid .  Continue antiplatelet therapy as prescribed Continue management of CAD, HTN and Hyperlipidemia Healthy heart diet,  encouraged exercise at least 4 times per week Follow up in 12 months with duplex ultrasound and physical exam   - VAS US CAROTID; Future  2. Essential hypertension Continue antihypertensive medications as already ordered, these medications have been  reviewed and there are no changes at this time.   3. Mixed hyperlipidemia Continue statin as ordered and reviewed, no changes at this time    Hortencia Pilar, MD  01/12/2019 10:19 AM

## 2020-01-14 ENCOUNTER — Encounter (INDEPENDENT_AMBULATORY_CARE_PROVIDER_SITE_OTHER): Payer: Self-pay | Admitting: Vascular Surgery

## 2020-01-14 ENCOUNTER — Ambulatory Visit (INDEPENDENT_AMBULATORY_CARE_PROVIDER_SITE_OTHER): Payer: Medicare HMO | Admitting: Vascular Surgery

## 2020-01-14 ENCOUNTER — Ambulatory Visit (INDEPENDENT_AMBULATORY_CARE_PROVIDER_SITE_OTHER): Payer: Medicare HMO

## 2020-01-14 ENCOUNTER — Other Ambulatory Visit: Payer: Self-pay

## 2020-01-14 VITALS — BP 108/66 | HR 67 | Resp 16 | Wt 129.8 lb

## 2020-01-14 DIAGNOSIS — E782 Mixed hyperlipidemia: Secondary | ICD-10-CM

## 2020-01-14 DIAGNOSIS — M5412 Radiculopathy, cervical region: Secondary | ICD-10-CM | POA: Diagnosis not present

## 2020-01-14 DIAGNOSIS — I6523 Occlusion and stenosis of bilateral carotid arteries: Secondary | ICD-10-CM

## 2020-01-14 DIAGNOSIS — I10 Essential (primary) hypertension: Secondary | ICD-10-CM

## 2020-01-14 NOTE — Progress Notes (Signed)
MRN : [id number]  Jennifer Oconnor is a 59 y.o. (11-21-1960) female who presents with chief complaint of  Chief Complaint  Patient presents with  . Follow-up    ultraosund follow up  .  History of Present Illness:   The patient is seen for follow up evaluation of carotid stenosis.She is s/p left CEA on 06/14/2017. No periprocedural complication.The carotid stenosis followed by ultrasound.   The patient denies amaurosis fugax. There is no recent history of TIA symptoms or focal motor deficits. There is no prior documented CVA.  The patient is taking enteric-coated aspirin 81 mg daily.  There is no history of migraine headaches. There is no history of seizures.  The patient has a history of coronary artery disease, no recent episodes of angina or shortness of breath. The patient denies PAD or claudication symptoms. There is a history of hyperlipidemia which is being treated with a statin.   Carotid Duplex done today showsRICA 78-29% and LICA widely patent s/p CEA. No change compared to last study   Current Meds  Medication Sig  . acetaminophen (TYLENOL) 325 MG tablet Take 1-2 tablets (325-650 mg total) by mouth every 4 (four) hours as needed for mild pain (or temp >/= 101 F).  . ALPRAZolam (XANAX) 0.5 MG tablet Take 0.5 mg by mouth 2 (two) times daily as needed for anxiety.  Marland Kitchen aspirin EC 81 MG EC tablet Take 1 tablet (81 mg total) by mouth daily.  Marland Kitchen atorvastatin (LIPITOR) 80 MG tablet Take 1 tablet (80 mg total) by mouth daily at 6 PM.  . buPROPion (WELLBUTRIN XL) 300 MG 24 hr tablet Take 300 mg by mouth daily. AM  . estradiol (VIVELLE-DOT) 0.025 MG/24HR Place 1 patch onto the skin 2 (two) times a week. Wed/Sun  . lisinopril (PRINIVIL,ZESTRIL) 5 MG tablet TK 1 T PO QD  . sertraline (ZOLOFT) 25 MG tablet Take 25 mg (1 tablet) every morning for one week then increase to 50 mg (two tablets) every morning.    Past Medical History:  Diagnosis Date  . Anxiety   .  Diverticulitis    in past  . Hypercholesteremia     Past Surgical History:  Procedure Laterality Date  . ABDOMINAL HYSTERECTOMY  1993  . COLON SURGERY     colectomy 9/11  . COLONOSCOPY N/A 10/15/2014   Procedure: COLONOSCOPY;  Surgeon: Lucilla Lame, MD;  Location: [facility];  Service: Gastroenterology;  Laterality: N/A;  cecum time- 1003  . ENDARTERECTOMY Left 06/14/2017   Procedure: ENDARTERECTOMY CAROTID;  Surgeon: Katha Cabal, MD;  Location: [facility];  Service: Vascular;  Laterality: Left;  . PILONIDAL CYST DRAINAGE  02/13/12  . POLYPECTOMY  10/15/2014   Procedure: POLYPECTOMY INTESTINAL;  Surgeon: Lucilla Lame, MD;  Location: [facility];  Service: Gastroenterology;;    Social History Social History   Tobacco Use  . Smoking status: Former Smoker    Packs/day: 0.50    Years: 40.00    Pack years: 20.00  . Smokeless tobacco: Never Used  Vaping Use  . Vaping Use: Never used  Substance Use Topics  . Alcohol use: No  . Drug use: No    Family History Family History  Problem Relation Age of Onset  . Heart attack Mother   . Alzheimer's disease Father     No Known Allergies   REVIEW OF SYSTEMS (Negative unless checked)  Constitutional: [] Weight loss  [] Fever  [] Chills Cardiac: [] Chest pain   [] Chest pressure   [] Palpitations   []   Shortness of breath when laying flat   [] Shortness of breath with exertion. Vascular:  [] Pain in legs with walking   [] Pain in legs at rest  [] History of DVT   [] Phlebitis   [] Swelling in legs   [] Varicose veins   [] Non-healing ulcers Pulmonary:   [] Uses home oxygen   [] Productive cough   [] Hemoptysis   [] Wheeze  [] COPD   [] Asthma Neurologic:  [] Dizziness   [] Seizures   [x] History of stroke   [] History of TIA  [] Aphasia   [] Vissual changes   [] Weakness or numbness in arm   [] Weakness or numbness in leg Musculoskeletal:   [] Joint swelling   [x] Joint pain   [] Low back pain Hematologic:  [] Easy bruising  [] Easy bleeding    [] Hypercoagulable state   [] Anemic Gastrointestinal:  [] Diarrhea   [] Vomiting  [] Gastroesophageal reflux/heartburn   [] Difficulty swallowing. Genitourinary:  [] Chronic kidney disease   [] Difficult urination  [] Frequent urination   [] Blood in urine Skin:  [] Rashes   [] Ulcers  Psychological:  [] History of anxiety   []  History of major depression.  Physical Examination  Vitals:   01/14/20 0919  BP: 108/66  Pulse: 67  Resp: 16  Weight: 129 lb 12.8 oz (58.9 kg)   Body mass index is 25.35 kg/m. Gen: WD/WN, NAD Head: [place]/AT, No temporalis wasting.  Ear/Nose/Throat: Hearing grossly intact, nares w/o erythema or drainage Eyes: PER, EOMI, sclera nonicteric.  Neck: Supple, no large masses.   Pulmonary:  Good air movement, no audible wheezing bilaterally, no use of accessory muscles.  Cardiac: RRR, no JVD Vascular: no carotid bruits Vessel Right Left  Radial Palpable Palpable  Carotid Palpable Palpable  Gastrointestinal: Non-distended. No guarding/no peritoneal signs.  Musculoskeletal: M/S 5/5 throughout.  No deformity or atrophy.  Neurologic: CN 2-12 intact. Symmetrical.  Speech is fluent. Motor exam as listed above. Psychiatric: Judgment intact, Mood & affect appropriate for pt's clinical situation. Dermatologic: No rashes or ulcers noted.  No changes consistent with cellulitis.   CBC Lab Results  Component Value Date   WBC 10.2 06/15/2017   HGB 12.7 06/15/2017   HCT 37.8 06/15/2017   MCV 95.2 06/15/2017   PLT 274 06/15/2017    BMET    Component Value Date/Time   NA 137 06/15/2017 0402   K 4.2 06/15/2017 0402   CL 108 06/15/2017 0402   CO2 22 06/15/2017 0402   GLUCOSE 151 (H) 06/15/2017 0402   BUN 15 06/15/2017 0402   CREATININE 0.98 06/15/2017 0402   CALCIUM 8.5 (L) 06/15/2017 0402   GFRNONAA >60 06/15/2017 0402   GFRAA >60 06/15/2017 0402   CrCl cannot be calculated (Patient's most recent lab result is older than the maximum 21 days allowed.).  COAG No results  found for: INR, PROTIME  Radiology No results found.   Assessment/Plan 1. Bilateral carotid artery stenosis Recommend:  Given the patient's asymptomatic subcritical stenosis no further invasive testing or surgery at this time.  Duplex ultrasound shows40-59% right and widley patent left internal carotid.  Continue antiplatelet therapy as prescribed Continue management of CAD, HTN and Hyperlipidemia Healthy heart diet, encouraged exercise at least 4 times per week Follow up in19months with duplex ultrasound and physical exam   - VAS US CAROTID; Future  2. Essential hypertension Continue antihypertensive medications as already ordered, these medications have been reviewed and there are no changes at this time.   3. Mixed hyperlipidemia Continue statin as ordered and reviewed, no changes at this time   4. Cervical radiculopathy Continue NSAID medications as already  ordered, these medications have been reviewed and there are no changes at this time.  Continued activity and therapy was stressed.    Hortencia Pilar, MD  01/14/2020 1:02 PM

## 2020-02-29 ENCOUNTER — Ambulatory Visit: Payer: Medicare HMO | Admitting: Dermatology

## 2020-02-29 ENCOUNTER — Encounter: Payer: Self-pay | Admitting: Dermatology

## 2020-02-29 ENCOUNTER — Other Ambulatory Visit: Payer: Self-pay

## 2020-02-29 DIAGNOSIS — L570 Actinic keratosis: Secondary | ICD-10-CM | POA: Diagnosis not present

## 2020-02-29 DIAGNOSIS — L578 Other skin changes due to chronic exposure to nonionizing radiation: Secondary | ICD-10-CM | POA: Diagnosis not present

## 2020-02-29 DIAGNOSIS — D489 Neoplasm of uncertain behavior, unspecified: Secondary | ICD-10-CM

## 2020-02-29 DIAGNOSIS — L720 Epidermal cyst: Secondary | ICD-10-CM | POA: Diagnosis not present

## 2020-02-29 DIAGNOSIS — C44519 Basal cell carcinoma of skin of other part of trunk: Secondary | ICD-10-CM

## 2020-02-29 DIAGNOSIS — C4491 Basal cell carcinoma of skin, unspecified: Secondary | ICD-10-CM

## 2020-02-29 HISTORY — DX: Basal cell carcinoma of skin, unspecified: C44.91

## 2020-02-29 NOTE — Progress Notes (Signed)
Follow-Up Visit   Subjective  Jennifer Oconnor is a 59 y.o. female who presents for the following: Area of concern (B/L upper chest x 3 months that are itchy, and little sore.)  Patient has no h/o skin cancers  The following portions of the chart were reviewed this encounter and updated as appropriate:  Tobacco  Allergies  Meds  Problems  Med Hx  Surg Hx  Fam Hx     Review of Systems:  No other skin or systemic complaints except as noted in HPI or Assessment and Plan.  Objective  Well appearing patient in no apparent distress; mood and affect are within normal limits.  A focused examination was performed including Chest. Relevant physical exam findings are noted in the Assessment and Plan.  Objective  Right Infraclavicular: Erythematous thin papules/macules with gritty scale.   Objective  Left Medial Infraclavicular: 1.1 crusted ulcer   Objective  Forehead: Smooth white papule(s).   Objective  Face: Diffuse scaly erythematous macules with underlying dyspigmentation.    Assessment & Plan  AK (actinic keratosis) Right Infraclavicular cryotherapy  Destruction of lesion - Right Infraclavicular Complexity: simple   Destruction method: cryotherapy   Informed consent: discussed and consent obtained   Timeout:  patient name, date of birth, surgical site, and procedure verified Lesion destroyed using liquid nitrogen: Yes   Region frozen until ice ball extended beyond lesion: Yes   Outcome: patient tolerated procedure well with no complications   Post-procedure details: wound care instructions given    Destruction of lesion - Right Infraclavicular  Neoplasm of uncertain behavior Left Medial Infraclavicular  Epidermal / dermal shaving  Lesion diameter (cm):  1.1 Informed consent: discussed and consent obtained   Timeout: patient name, date of birth, surgical site, and procedure verified   Procedure prep:  Patient was prepped and draped in usual sterile  fashion Prep type:  Isopropyl alcohol Anesthesia: the lesion was anesthetized in a standard fashion   Anesthetic:  1% lidocaine w/ epinephrine 1-100,000 buffered w/ 8.4% NaHCO3 Instrument used: flexible razor blade   Hemostasis achieved with: pressure, aluminum chloride and electrodesiccation   Outcome: patient tolerated procedure well   Post-procedure details: sterile dressing applied and wound care instructions given   Dressing type: bandage and petrolatum    Destruction of lesion  Destruction method: electrodesiccation and curettage   Informed consent: discussed and consent obtained   Timeout:  patient name, date of birth, surgical site, and procedure verified Patient was prepped and draped in usual sterile fashion: area prepped with Isopropyl Alcohol. Curettage performed in three different directions: Yes   Electrodesiccation performed over the curetted area: Yes   Lesion length (cm):  1.1 Lesion width (cm):  1.1 Margin per side (cm):  0.2 Final wound size (cm):  1.5 Hemostasis achieved with:  pressure, aluminum chloride and electrodesiccation Outcome: patient tolerated procedure well with no complications   Post-procedure details: wound care instructions given   Post-procedure details comment:  Ointment and bandage applied  Specimen 1 - Surgical pathology Differential Diagnosis: BCC vs SCC Check Margins: No 1.1 crusted ulcer L medical infraclavicular  Milia Forehead Benign, observe.   Actinic Damage - diffuse scaly erythematous macules with underlying dyspigmentation - Recommend daily broad spectrum sunscreen SPF 30+ to sun-exposed areas, reapply every 2 hours as needed.  - Call for new or changing lesions.  Return in about 4 months (around 06/30/2020) for TBSE.  I, Donzetta Kohut, CMA, am acting as scribe for Sarina Ser, MD . Documentation: I have  reviewed the above documentation for accuracy and completeness, and I agree with the above.  Sarina Ser,  MD

## 2020-02-29 NOTE — Patient Instructions (Addendum)
Recommend daily broad spectrum sunscreen SPF 30+ to sun-exposed areas, reapply every 2 hours as needed. Call for new or changing lesions.  Prior to procedure, discussed risks of blister formation, small wound, skin dyspigmentation, or rare scar following cryotherapy.  Liquid nitrogen was applied for 10-12 seconds to the skin lesion and the expected blistering or scabbing reaction explained. Do not pick at the area. Patient reminded to expect hypopigmented scars from the procedure. Return if lesion fails to fully resolve.  Cryotherapy Aftercare  . Wash gently with soap and water everyday.   Jennifer Oconnor Apply Vaseline and Band-Aid daily until healed.  Wound Care Instructions  1. Cleanse wound gently with soap and water once a day then pat dry with clean gauze. Apply a thing coat of Petrolatum (petroleum jelly, "Vaseline") over the wound (unless you have an allergy to this). We recommend that you use a new, sterile tube of Vaseline. Do not pick or remove scabs. Do not remove the yellow or white "healing tissue" from the base of the wound.  2. Cover the wound with fresh, clean, nonstick gauze and secure with paper tape. You may use Band-Aids in place of gauze and tape if the would is small enough, but would recommend trimming much of the tape off as there is often too much. Sometimes Band-Aids can irritate the skin.  3. You should call the office for your biopsy report after 1 week if you have not already been contacted.  4. If you experience any problems, such as abnormal amounts of bleeding, swelling, significant bruising, significant pain, or evidence of infection, please call the office immediately.  5. FOR ADULT SURGERY PATIENTS: If you need something for pain relief you may take 1 extra strength Tylenol (acetaminophen) AND 2 Ibuprofen (200mg  each) together every 4 hours as needed for pain. (do not take these if you are allergic to them or if you have a reason you should not take them.) Typically, you may  only need pain medication for 1 to 3 days.    Electrodesiccation and Curettage ("Scrape and Burn") Wound Care Instructions  6. Leave the original bandage on for 24 hours if possible.  If the bandage becomes soaked or soiled before that time, it is OK to remove it and examine the wound.  A small amount of post-operative bleeding is normal.  If excessive bleeding occurs, remove the bandage, place gauze over the site and apply continuous pressure (no peeking) over the area for 30 minutes. If this does not work, please call our clinic as soon as possible or page your doctor if it is after hours.   7. Once a day, cleanse the wound with soap and water. It is fine to shower. If a thick crust develops you may use a Q-tip dipped into dilute hydrogen peroxide (mix 1:1 with water) to dissolve it.  Hydrogen peroxide can slow the healing process, so use it only as needed.    8. After washing, apply petroleum jelly (Vaseline) or an antibiotic ointment if your doctor prescribed one for you, followed by a bandage.    9. For best healing, the wound should be covered with a layer of ointment at all times. If you are not able to keep the area covered with a bandage to hold the ointment in place, this may mean re-applying the ointment several times a day.  Continue this wound care until the wound has healed and is no longer open. It may take several weeks for the wound to heal and  close.  Itching and mild discomfort is normal during the healing process.  If you have any discomfort, you can take Tylenol (acetaminophen) or ibuprofen as directed on the bottle. (Please do not take these if you have an allergy to them or cannot take them for another reason).  Some redness, tenderness and white or yellow material in the wound is normal healing.  If the area becomes very sore and red, or develops a thick yellow-green material (pus), it may be infected; please notify us.    Wound healing continues for up to one year  following surgery. It is not unusual to experience pain in the scar from time to time during the interval.  If the pain becomes severe or the scar thickens, you should notify the office.    A slight amount of redness in a scar is expected for the first six months.  After six months, the redness will fade and the scar will soften and fade.  The color difference becomes less noticeable with time.  If there are any problems, return for a post-op surgery check at your earliest convenience.  To improve the appearance of the scar, you can use silicone scar gel, cream, or sheets (such as Mederma or Serica) every night for up to one year. These are available over the counter (without a prescription).  Please call our office at [phone] for any questions or concerns.

## 2020-03-07 ENCOUNTER — Telehealth: Payer: Self-pay

## 2020-03-07 NOTE — Telephone Encounter (Signed)
Discussed biopsy results with pt  °

## 2020-03-07 NOTE — Telephone Encounter (Signed)
-----   Message from Ralene Bathe, MD sent at 03/07/2020 12:38 PM EDT ----- Skin , left medial infraclavicular BASAL CELL CARCINOMA, NODULAR PATTERN  Cancer - BCC Already treated Recheck at next visit in 4 mos

## 2020-07-25 ENCOUNTER — Ambulatory Visit (INDEPENDENT_AMBULATORY_CARE_PROVIDER_SITE_OTHER): Payer: Medicare HMO | Admitting: Dermatology

## 2020-07-25 ENCOUNTER — Encounter: Payer: Self-pay | Admitting: Dermatology

## 2020-07-25 ENCOUNTER — Other Ambulatory Visit: Payer: Self-pay

## 2020-07-25 DIAGNOSIS — L578 Other skin changes due to chronic exposure to nonionizing radiation: Secondary | ICD-10-CM | POA: Diagnosis not present

## 2020-07-25 DIAGNOSIS — L720 Epidermal cyst: Secondary | ICD-10-CM | POA: Diagnosis not present

## 2020-07-25 DIAGNOSIS — L570 Actinic keratosis: Secondary | ICD-10-CM

## 2020-07-25 DIAGNOSIS — Z85828 Personal history of other malignant neoplasm of skin: Secondary | ICD-10-CM | POA: Diagnosis not present

## 2020-07-25 DIAGNOSIS — D1800 Hemangioma unspecified site: Secondary | ICD-10-CM

## 2020-07-25 DIAGNOSIS — L814 Other melanin hyperpigmentation: Secondary | ICD-10-CM

## 2020-07-25 DIAGNOSIS — Z1283 Encounter for screening for malignant neoplasm of skin: Secondary | ICD-10-CM

## 2020-07-25 DIAGNOSIS — D229 Melanocytic nevi, unspecified: Secondary | ICD-10-CM

## 2020-07-25 DIAGNOSIS — L821 Other seborrheic keratosis: Secondary | ICD-10-CM

## 2020-07-25 DIAGNOSIS — D692 Other nonthrombocytopenic purpura: Secondary | ICD-10-CM

## 2020-07-25 NOTE — Progress Notes (Signed)
Follow-Up Visit   Subjective  Jennifer Oconnor is a 60 y.o. female who presents for the following: Annual Exam (Hx BCC, Hx AK's ). The patient presents for Total-Body Skin Exam (TBSE) for skin cancer screening and mole check.  The following portions of the chart were reviewed this encounter and updated as appropriate:   Tobacco  Allergies  Meds  Problems  Med Hx  Surg Hx  Fam Hx     Review of Systems:  No other skin or systemic complaints except as noted in HPI or Assessment and Plan.  Objective  Well appearing patient in no apparent distress; mood and affect are within normal limits.  A full examination was performed including scalp, head, eyes, ears, nose, lips, neck, chest, axillae, abdomen, back, buttocks, bilateral upper extremities, bilateral lower extremities, hands, feet, fingers, toes, fingernails, and toenails. All findings within normal limits unless otherwise noted below.  Objective  glabella: Subcutaneous nodule.   Objective  Face: Smooth white papule(s).   Objective  R cheek, R forehead (2): Erythematous thin papules/macules with gritty scale.   Assessment & Plan  Epidermal inclusion cyst glabella Discussed surgical option Benign-appearing.  Observation.  Call clinic for new or changing lesions.  Recommend daily use of broad spectrum spf 30+ sunscreen to sun-exposed areas.    Milia Face Benign-appearing.  Observation.  Call clinic for new or changing lesions.  Recommend daily use of broad spectrum spf 30+ sunscreen to sun-exposed areas.    AK (actinic keratosis) (2) R cheek, R forehead  Destruction of lesion - R cheek, R forehead Complexity: simple   Destruction method: cryotherapy   Informed consent: discussed and consent obtained   Timeout:  patient name, date of birth, surgical site, and procedure verified Lesion destroyed using liquid nitrogen: Yes   Region frozen until ice ball extended beyond lesion: Yes   Outcome: patient tolerated  procedure well with no complications   Post-procedure details: wound care instructions given    Skin cancer screening   Lentigines - Scattered tan macules - Discussed due to sun exposure - Benign, observe - Call for any changes  Seborrheic Keratoses - Stuck-on, waxy, tan-brown papules and plaques  - Discussed benign etiology and prognosis. - Observe - Call for any changes  Melanocytic Nevi - Tan-brown and/or pink-flesh-colored symmetric macules and papules - Benign appearing on exam today - Observation - Call clinic for new or changing moles - Recommend daily use of broad spectrum spf 30+ sunscreen to sun-exposed areas.   Hemangiomas - Red papules - Discussed benign nature - Observe - Call for any changes  Actinic Damage - Chronic, secondary to cumulative UV/sun exposure - diffuse scaly erythematous macules with underlying dyspigmentation - Recommend daily broad spectrum sunscreen SPF 30+ to sun-exposed areas, reapply every 2 hours as needed.  - Call for new or changing lesions.  History of Basal Cell Carcinoma of the Skin - No evidence of recurrence today - Recommend regular full body skin exams - Recommend daily broad spectrum sunscreen SPF 30+ to sun-exposed areas, reapply every 2 hours as needed.  - Call if any new or changing lesions are noted between office visits  Purpura - Chronic; persistent and recurrent.  Treatable, but not curable. - Violaceous macules and patches - Benign - Related to trauma, age, sun damage and/or use of blood thinners, chronic use of topical and/or oral steroids - Observe - Can use OTC arnica containing moisturizer such as Dermend Bruise Formula if desired - Call for worsening or other  concerns  Skin cancer screening performed today.  Return in about 6 months (around 01/22/2021) for sun exposed areas - AK's, Hx BCC .  Luther Redo, CMA, am acting as scribe for Sarina Ser, MD .  Documentation: I have reviewed the above  documentation for accuracy and completeness, and I agree with the above.  Sarina Ser, MD

## 2020-07-26 ENCOUNTER — Encounter: Payer: Self-pay | Admitting: Dermatology

## 2021-01-10 NOTE — Progress Notes (Signed)
MRN : [id number]  Jennifer Oconnor is a 60 y.o. (1960-11-28) female who presents with chief complaint of check up on the carotid arteries.  History of Present Illness:     The patient is seen for follow up evaluation of carotid stenosis. She is s/p left CEA on 06/14/2017.  The carotid stenosis followed by ultrasound.   The patient denies amaurosis fugax. There is no recent history of TIA symptoms or focal motor deficits. 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   The patient is taking enteric-coated aspirin 81 mg daily.   There is no history of migraine headaches. There is no history of seizures.   The patient has a history of coronary artery disease, no recent episodes of angina or shortness of breath. The patient denies PAD or claudication symptoms. There is a history of hyperlipidemia which is being treated with a statin.     Carotid Duplex done today shows RICA [id number] and LICA widely patent s/p CEA.  No change compared to last study  No outpatient medications have been marked as taking for the 01/12/21 encounter (Appointment) with Delana Meyer, Dolores Lory, MD.    Past Medical History:  Diagnosis Date   Anxiety    Basal cell carcinoma 02/29/2020   left medial infraclavicular    Diverticulitis    in past   Hypercholesteremia     Past Surgical History:  Procedure Laterality Date   [place]     colectomy 9/11   COLONOSCOPY N/A 10/15/2014   Procedure: COLONOSCOPY;  Surgeon: Lucilla Lame, MD;  Location: [place];  Service: Gastroenterology;  Laterality: N/A;  cecum time- 1003   ENDARTERECTOMY Left 06/14/2017   Procedure: ENDARTERECTOMY CAROTID;  Surgeon: Katha Cabal, MD;  Location: [facility];  Service: Vascular;  Laterality: Left;   PILONIDAL CYST DRAINAGE  02/13/12   POLYPECTOMY  10/15/2014   Procedure: POLYPECTOMY INTESTINAL;  Surgeon: Lucilla Lame, MD;  Location: [place];  Service:  Gastroenterology;;    Social History Social History   Tobacco Use   Smoking status: Former    Packs/day: 0.50    Years: 40.00    Pack years: 20.00    Types: Cigarettes   Smokeless tobacco: Never  Vaping Use   Vaping Use: Never used  Substance Use Topics   Alcohol use: No   Drug use: No    Family History Family History  Problem Relation Age of Onset   Heart attack Mother    Alzheimer's disease Father     No Known Allergies   REVIEW OF SYSTEMS (Negative unless checked)  Constitutional: '[]'$ Weight loss  '[]'$ Fever  '[]'$ Chills Cardiac: '[]'$ Chest pain   '[]'$ Chest pressure   '[]'$ Palpitations   '[]'$ Shortness of breath when laying flat   '[]'$ Shortness of breath with exertion. Vascular:  '[]'$ Pain in legs with walking   '[]'$ Pain in legs at rest  '[]'$ History of DVT   '[]'$ Phlebitis   '[]'$ Swelling in legs   '[]'$ Varicose veins   '[]'$ Non-healing ulcers Pulmonary:   '[]'$ Uses home oxygen   '[]'$ Productive cough   '[]'$ Hemoptysis   '[]'$ Wheeze  '[]'$ COPD   '[]'$ Asthma Neurologic:  '[]'$ Dizziness   '[]'$ Seizures   '[x]'$ History of stroke   '[]'$ History of TIA  '[]'$ Aphasia   '[]'$ Vissual changes   '[]'$ Weakness or numbness in arm   '[]'$ Weakness or numbness in leg Musculoskeletal:   '[]'$ Joint swelling   '[]'$ Joint pain   '[]'$ Low back pain Hematologic:  '[]'$ Easy bruising  '[]'$ Easy  bleeding   '[]'$ Hypercoagulable state   '[]'$ Anemic Gastrointestinal:  '[]'$ Diarrhea   '[]'$ Vomiting  '[]'$ Gastroesophageal reflux/heartburn   '[]'$ Difficulty swallowing. Genitourinary:  '[]'$ Chronic kidney disease   '[]'$ Difficult urination  '[]'$ Frequent urination   '[]'$ Blood in urine Skin:  '[]'$ Rashes   '[]'$ Ulcers  Psychological:  '[]'$ History of anxiety   '[]'$  History of major depression.  Physical Examination  There were no vitals filed for this visit. There is no height or weight on file to calculate BMI. Gen: WD/WN, NAD Head: [place]/AT, No temporalis wasting.  Ear/Nose/Throat: Hearing grossly intact, nares w/o erythema or drainage Eyes: PER, EOMI, sclera nonicteric.  Neck: Supple, no masses.  No bruit or JVD.  Pulmonary:   Good air movement, no audible wheezing, no use of accessory muscles.  Cardiac: RRR, normal S1, S2, no Murmurs. Vascular:   left CEA scar well healed, no bruits Vessel Right Left  Radial Palpable Palpable  Carotid Palpable Palpable  Gastrointestinal: soft, non-distended. No guarding/no peritoneal signs.  Musculoskeletal: M/S 5/5 throughout.  No visible deformity.  Neurologic: CN 2-12 intact. Pain and light touch intact in extremities.  Symmetrical.  Speech is fluent. Motor exam as listed above. Psychiatric: Judgment intact, Mood & affect appropriate for pt's clinical situation. Dermatologic: No rashes or ulcers noted.  No changes consistent with cellulitis.   CBC Lab Results  Component Value Date   WBC 10.2 06/15/2017   HGB 12.7 06/15/2017   HCT 37.8 06/15/2017   MCV 95.2 06/15/2017   PLT 274 06/15/2017    BMET    Component Value Date/Time   NA 137 06/15/2017 0402   K 4.2 06/15/2017 0402   CL 108 06/15/2017 0402   CO2 22 06/15/2017 0402   GLUCOSE 151 (H) 06/15/2017 0402   BUN 15 06/15/2017 0402   CREATININE 0.98 06/15/2017 0402   CALCIUM 8.5 (L) 06/15/2017 0402   GFRNONAA >60 06/15/2017 0402   GFRAA >60 06/15/2017 0402   CrCl cannot be calculated (Patient's most recent lab result is older than the maximum 21 days allowed.).  COAG No results found for: INR, PROTIME  Radiology No results found.   Assessment/Plan 1. Bilateral carotid artery stenosis Recommend:   Given the patient's asymptomatic subcritical stenosis no further invasive testing or surgery at this time.   Duplex ultrasound shows 40-59% right and widley patent left internal carotid .   Continue antiplatelet therapy as prescribed Continue management of CAD, HTN and Hyperlipidemia Healthy heart diet,  encouraged exercise at least 4 times per week Follow up in 12 months with duplex ultrasound and physical exam.   - VAS US CAROTID; Future  2. Essential hypertension Continue antihypertensive  medications as already ordered, these medications have been reviewed and there are no changes at this time.   3. Mixed hyperlipidemia Continue statin as ordered and reviewed, no changes at this time   4. Cervical radiculopathy Continue NSAID medications as already ordered, these medications have been reviewed and there are no changes at this time.  Continued activity and therapy was stressed.     Hortencia Pilar, MD  01/10/2021 2:28 PM

## 2021-01-12 ENCOUNTER — Other Ambulatory Visit: Payer: Self-pay

## 2021-01-12 ENCOUNTER — Encounter (INDEPENDENT_AMBULATORY_CARE_PROVIDER_SITE_OTHER): Payer: Self-pay | Admitting: Vascular Surgery

## 2021-01-12 ENCOUNTER — Ambulatory Visit (INDEPENDENT_AMBULATORY_CARE_PROVIDER_SITE_OTHER): Payer: Medicare HMO | Admitting: Vascular Surgery

## 2021-01-12 ENCOUNTER — Ambulatory Visit (INDEPENDENT_AMBULATORY_CARE_PROVIDER_SITE_OTHER): Payer: Medicare HMO

## 2021-01-12 VITALS — BP 122/68 | HR 69 | Resp 16 | Wt 139.8 lb

## 2021-01-12 DIAGNOSIS — I10 Essential (primary) hypertension: Secondary | ICD-10-CM

## 2021-01-12 DIAGNOSIS — M5412 Radiculopathy, cervical region: Secondary | ICD-10-CM | POA: Diagnosis not present

## 2021-01-12 DIAGNOSIS — I6523 Occlusion and stenosis of bilateral carotid arteries: Secondary | ICD-10-CM | POA: Diagnosis not present

## 2021-01-12 DIAGNOSIS — E782 Mixed hyperlipidemia: Secondary | ICD-10-CM

## 2021-01-25 ENCOUNTER — Ambulatory Visit: Payer: Medicare HMO | Admitting: Dermatology

## 2021-01-25 ENCOUNTER — Other Ambulatory Visit: Payer: Self-pay

## 2021-01-25 DIAGNOSIS — L578 Other skin changes due to chronic exposure to nonionizing radiation: Secondary | ICD-10-CM

## 2021-01-25 DIAGNOSIS — Z872 Personal history of diseases of the skin and subcutaneous tissue: Secondary | ICD-10-CM | POA: Diagnosis not present

## 2021-01-25 DIAGNOSIS — L821 Other seborrheic keratosis: Secondary | ICD-10-CM

## 2021-01-25 NOTE — Progress Notes (Signed)
   Follow-Up Visit   Subjective  Jennifer Oconnor is a 60 y.o. female who presents for the following: Actinic Keratosis (6 months f/u sun exposed areas ).  The following portions of the chart were reviewed this encounter and updated as appropriate:   Tobacco  Allergies  Meds  Problems  Med Hx  Surg Hx  Fam Hx     Review of Systems:  No other skin or systemic complaints except as noted in HPI or Assessment and Plan.  Objective  Well appearing patient in no apparent distress; mood and affect are within normal limits.  A focused examination was performed including face,arms. Relevant physical exam findings are noted in the Assessment and Plan.  face Clear skin    Assessment & Plan  Hx of actinic keratosis face  Actinic keratoses are precancerous spots that appear secondary to cumulative UV radiation exposure/sun exposure over time. They are chronic with expected duration over 1 year. A portion of actinic keratoses will progress to squamous cell carcinoma of the skin. It is not possible to reliably predict which spots will progress to skin cancer and so treatment is recommended to prevent development of skin cancer.  Recommend daily broad spectrum sunscreen SPF 30+ to sun-exposed areas, reapply every 2 hours as needed.  Recommend staying in the shade or wearing long sleeves, sun glasses (UVA+UVB protection) and wide brim hats (4-inch brim around the entire circumference of the hat). Call for new or changing lesions.   Actinic Damage - chronic, secondary to cumulative UV radiation exposure/sun exposure over time - diffuse scaly erythematous macules with underlying dyspigmentation - Recommend daily broad spectrum sunscreen SPF 30+ to sun-exposed areas, reapply every 2 hours as needed.  - Recommend staying in the shade or wearing long sleeves, sun glasses (UVA+UVB protection) and wide brim hats (4-inch brim around the entire circumference of the hat). - Call for new or changing  lesions.   Seborrheic Keratoses - Stuck-on, waxy, tan-brown papules and/or plaques  - Benign-appearing - Discussed benign etiology and prognosis. - Observe - Call for any changes  Return in about 6 months (around 07/28/2021) for TBSE, hx of BCC, Hx of AKs .  IMarye Round, CMA, am acting as scribe for Sarina Ser, MD .  Documentation: I have reviewed the above documentation for accuracy and completeness, and I agree with the above.  Sarina Ser, MD

## 2021-01-25 NOTE — Progress Notes (Deleted)
   Follow-Up Visit   Subjective  Consetta Akita is a 60 y.o. female who presents for the following: Actinic Keratosis (6 months f/u sun exposed areas ).    The following portions of the chart were reviewed this encounter and updated as appropriate:       Review of Systems:  No other skin or systemic complaints except as noted in HPI or Assessment and Plan.  Objective  Well appearing patient in no apparent distress; mood and affect are within normal limits.  [id number] full examination was performed including scalp, head, eyes, ears, nose, lips, neck, chest, axillae, abdomen, back, buttocks, bilateral upper extremities, bilateral lower extremities, hands, feet, fingers, toes, fingernails, and toenails. All findings within normal limits unless otherwise noted below."}    Assessment & Plan   No follow-ups on file.    Follow-Up Visit   Subjective  Illyria Filipe is a 60 y.o. female who presents for the following: Actinic Keratosis (6 months f/u sun exposed areas ).  ***  The following portions of the chart were reviewed this encounter and updated as appropriate:       Review of Systems:  No other skin or systemic complaints except as noted in HPI or Assessment and Plan.  Objective  Well appearing patient in no apparent distress; mood and affect are within normal limits.  [id number] full examination was performed including scalp, head, eyes, ears, nose, lips, neck, chest, axillae, abdomen, back, buttocks, bilateral upper extremities, bilateral lower extremities, hands, feet, fingers, toes, fingernails, and toenails. All findings within normal limits unless otherwise noted below."}    Assessment & Plan   No follow-ups on file.

## 2021-01-25 NOTE — Patient Instructions (Signed)

## 2021-01-26 ENCOUNTER — Encounter: Payer: Self-pay | Admitting: Dermatology

## 2021-04-04 ENCOUNTER — Encounter: Payer: Self-pay | Admitting: Gastroenterology

## 2021-04-04 ENCOUNTER — Ambulatory Visit: Payer: Medicare HMO | Admitting: Gastroenterology

## 2021-04-04 ENCOUNTER — Other Ambulatory Visit: Payer: Self-pay

## 2021-04-04 VITALS — BP 137/67 | HR 83 | Temp 98.3°F | Ht 60.0 in | Wt 143.4 lb

## 2021-04-04 DIAGNOSIS — K5732 Diverticulitis of large intestine without perforation or abscess without bleeding: Secondary | ICD-10-CM | POA: Diagnosis not present

## 2021-04-04 MED ORDER — SUTAB 1479-225-188 MG PO TABS: 1.0000 | ORAL_TABLET | ORAL | 0 refills | Status: DC

## 2021-04-04 NOTE — H&P (View-Only) (Signed)
Gastroenterology Consultation  Referring Provider:     Albina Billet, MD Primary Care Physician:  Albina Billet, MD Primary Gastroenterologist:  Dr. Allen Norris     Reason for Consultation:     abdominal pain        HPI:   Jennifer Oconnor is a 60 y.o. y/o female referred for consultation & management of abdominal pain by Dr. Hall Busing, Leona Carry, MD.  This patient comes in today with a history of diverticulitis and she states that she had surgery with Dr. Pat Patrick for this in the past.  The patient reports that she was good until the end of August when she started to have some pain and was given Cipro and Flagyl and the pain went away.  The patient has a history of colon polyps and is due for a repeat colonoscopy.  Her last colonoscopy was in 2016 and she was supposed to have a repeat colonoscopy for 2 polyps found at that time in 5 years.  There is no report of any unexplained weight loss fevers chills nausea vomiting black stools or bloody stools.  The patient also states that she is not having any further abdominal pain.  Past Medical History:  Diagnosis Date   Anxiety    Basal cell carcinoma 02/29/2020   left medial infraclavicular    Diverticulitis    in past   Hypercholesteremia     Past Surgical History:  Procedure Laterality Date   ABDOMINAL HYSTERECTOMY  1993   COLON SURGERY     colectomy 9/11   COLONOSCOPY N/A 10/15/2014   Procedure: COLONOSCOPY;  Surgeon: Lucilla Lame, MD;  Location: [place];  Service: Gastroenterology;  Laterality: N/A;  cecum time- 1003   ENDARTERECTOMY Left 06/14/2017   Procedure: ENDARTERECTOMY CAROTID;  Surgeon: Katha Cabal, MD;  Location: [facility];  Service: Vascular;  Laterality: Left;   PILONIDAL CYST DRAINAGE  02/13/12   POLYPECTOMY  10/15/2014   Procedure: POLYPECTOMY INTESTINAL;  Surgeon: Lucilla Lame, MD;  Location: [place];  Service: Gastroenterology;;    Prior to Admission medications   Medication Sig Start Date End Date  Taking? Authorizing Provider  acetaminophen (TYLENOL) 325 MG tablet Take 1-2 tablets (325-650 mg total) by mouth every 4 (four) hours as needed for mild pain (or temp >/= 101 F). 06/16/17   Gouru, Illene Silver, MD  ALPRAZolam [name]) 0.5 MG tablet Take 0.5 mg by mouth 2 (two) times daily as needed for anxiety.    [provider]  aspirin EC 81 MG EC tablet Take 1 tablet (81 mg total) by mouth daily. 06/17/17   Nicholes Mango, MD  atorvastatin (LIPITOR) 80 MG tablet Take 1 tablet (80 mg total) by mouth daily at 6 PM. 06/16/17   Gouru, Illene Silver, MD  buPROPion (WELLBUTRIN XL) 150 MG 24 hr tablet  01/22/20   [provider]  buPROPion (WELLBUTRIN XL) 300 MG 24 hr tablet Take 300 mg by mouth daily. AM Patient not taking: Reported on 01/12/2021    [provider]  estradiol (VIVELLE-DOT) 0.025 MG/24HR Place 1 patch onto the skin 2 (two) times a week. Wed/Sun    [provider]  lisinopril (PRINIVIL,ZESTRIL) 5 MG tablet TK 1 T PO QD 06/24/17   [provider]  oxyCODONE (OXY IR/ROXICODONE) 5 MG immediate release tablet Take 1-2 tablets (5-10 mg total) by mouth every 4 (four) hours as needed for moderate pain. Patient not taking: Reported on 01/12/2021 06/16/17   Nicholes Mango, MD  sertraline (  ZOLOFT) 25 MG tablet Take 25 mg (1 tablet) every morning for one week then increase to 50 mg (two tablets) every morning. 10/15/17   [provider]    Family History  Problem Relation Age of Onset   Heart attack Mother    Alzheimer's disease Father      Social History   Tobacco Use   Smoking status: Former    Packs/day: 0.50    Years: 40.00    Pack years: 20.00    Types: Cigarettes   Smokeless tobacco: Never  Vaping Use   Vaping Use: Never used  Substance Use Topics   Alcohol use: No   Drug use: No    Allergies as of 04/04/2021   (No Known Allergies)    Review of Systems:    All systems reviewed and negative except where noted in HPI.   Physical Exam:  There  were no vitals taken for this visit. No LMP recorded. Patient has had a hysterectomy. General:   Alert,  Well-developed, well-nourished, pleasant and cooperative in NAD Head:  Normocephalic and atraumatic. Eyes:  Sclera clear, no icterus.   Conjunctiva pink. Ears:  Normal auditory acuity. Neck:  Supple; no masses or thyromegaly. Lungs:  Respirations even and unlabored.  Clear throughout to auscultation.   No wheezes, crackles, or rhonchi. No acute distress. Heart:  Regular rate and rhythm; no murmurs, clicks, rubs, or gallops. Abdomen:  Normal bowel sounds.  No bruits.  Soft, non-tender and non-distended without masses, hepatosplenomegaly or hernias noted.  No guarding or rebound tenderness.  Negative Carnett sign.   Rectal:  Deferred.  Pulses:  Normal pulses noted. Extremities:  No clubbing or edema.  No cyanosis. Neurologic:  Alert and oriented x3;  grossly normal neurologically. Skin:  Intact without significant lesions or rashes.  No jaundice. Lymph Nodes:  No significant cervical adenopathy. Psych:  Alert and cooperative. Normal mood and affect.  Imaging Studies: No results found.  Assessment and Plan:   Jennifer Oconnor is a 60 y.o. y/o female who comes in today with a history of diverticulitis with a recent episode.  The patient was treated with antibiotics and states that her symptoms have completely resolved.  There is no report of any worry symptoms such as rectal bleeding black stools bloody stools or unexplained weight loss.  The patient is due for colonoscopy since she had polyps in 2016.  The patient will be set up for colonoscopy due to her history of colon polyps.  The patient has been explained the plan and agrees with it.    Lucilla Lame, MD. Marval Regal    Note: This dictation was prepared with Dragon dictation along with smaller phrase technology. Any transcriptional errors that result from this process are unintentional.

## 2021-04-04 NOTE — Progress Notes (Signed)
Gastroenterology Consultation  Referring Provider:     Albina Billet, MD Primary Care Physician:  Albina Billet, MD Primary Gastroenterologist:  Dr. Allen Norris     Reason for Consultation:     abdominal pain        HPI:   Jennifer Oconnor is a 60 y.o. y/o female referred for consultation & management of abdominal pain by Dr. Hall Busing, Leona Carry, MD.  This patient comes in today with a history of diverticulitis and she states that she had surgery with Dr. Pat Patrick for this in the past.  The patient reports that she was good until the end of August when she started to have some pain and was given Cipro and Flagyl and the pain went away.  The patient has a history of colon polyps and is due for a repeat colonoscopy.  Her last colonoscopy was in 2016 and she was supposed to have a repeat colonoscopy for 2 polyps found at that time in 5 years.  There is no report of any unexplained weight loss fevers chills nausea vomiting black stools or bloody stools.  The patient also states that she is not having any further abdominal pain.  Past Medical History:  Diagnosis Date   Anxiety    Basal cell carcinoma 02/29/2020   left medial infraclavicular    Diverticulitis    in past   Hypercholesteremia     Past Surgical History:  Procedure Laterality Date   ABDOMINAL HYSTERECTOMY  1993   COLON SURGERY     colectomy 9/11   COLONOSCOPY N/A 10/15/2014   Procedure: COLONOSCOPY;  Surgeon: Lucilla Lame, MD;  Location: [place];  Service: Gastroenterology;  Laterality: N/A;  cecum time- 1003   ENDARTERECTOMY Left 06/14/2017   Procedure: ENDARTERECTOMY CAROTID;  Surgeon: Katha Cabal, MD;  Location: [facility];  Service: Vascular;  Laterality: Left;   PILONIDAL CYST DRAINAGE  02/13/12   POLYPECTOMY  10/15/2014   Procedure: POLYPECTOMY INTESTINAL;  Surgeon: Lucilla Lame, MD;  Location: [place];  Service: Gastroenterology;;    Prior to Admission medications   Medication Sig Start Date End Date  Taking? Authorizing Provider  acetaminophen (TYLENOL) 325 MG tablet Take 1-2 tablets (325-650 mg total) by mouth every 4 (four) hours as needed for mild pain (or temp >/= 101 F). 06/16/17   Gouru, Illene Silver, MD  ALPRAZolam [name]) 0.5 MG tablet Take 0.5 mg by mouth 2 (two) times daily as needed for anxiety.    [provider]  aspirin EC 81 MG EC tablet Take 1 tablet (81 mg total) by mouth daily. 06/17/17   Nicholes Mango, MD  atorvastatin (LIPITOR) 80 MG tablet Take 1 tablet (80 mg total) by mouth daily at 6 PM. 06/16/17   Gouru, Illene Silver, MD  buPROPion (WELLBUTRIN XL) 150 MG 24 hr tablet  01/22/20   [provider]  buPROPion (WELLBUTRIN XL) 300 MG 24 hr tablet Take 300 mg by mouth daily. AM Patient not taking: Reported on 01/12/2021    [provider]  estradiol (VIVELLE-DOT) 0.025 MG/24HR Place 1 patch onto the skin 2 (two) times a week. Wed/Sun    [provider]  lisinopril (PRINIVIL,ZESTRIL) 5 MG tablet TK 1 T PO QD 06/24/17   [provider]  oxyCODONE (OXY IR/ROXICODONE) 5 MG immediate release tablet Take 1-2 tablets (5-10 mg total) by mouth every 4 (four) hours as needed for moderate pain. Patient not taking: Reported on 01/12/2021 06/16/17   Nicholes Mango, MD  sertraline (  ZOLOFT) 25 MG tablet Take 25 mg (1 tablet) every morning for one week then increase to 50 mg (two tablets) every morning. 10/15/17   [provider]    Family History  Problem Relation Age of Onset   Heart attack Mother    Alzheimer's disease Father      Social History   Tobacco Use   Smoking status: Former    Packs/day: 0.50    Years: 40.00    Pack years: 20.00    Types: Cigarettes   Smokeless tobacco: Never  Vaping Use   Vaping Use: Never used  Substance Use Topics   Alcohol use: No   Drug use: No    Allergies as of 04/04/2021   (No Known Allergies)    Review of Systems:    All systems reviewed and negative except where noted in HPI.   Physical Exam:  There  were no vitals taken for this visit. No LMP recorded. Patient has had a hysterectomy. General:   Alert,  Well-developed, well-nourished, pleasant and cooperative in NAD Head:  Normocephalic and atraumatic. Eyes:  Sclera clear, no icterus.   Conjunctiva pink. Ears:  Normal auditory acuity. Neck:  Supple; no masses or thyromegaly. Lungs:  Respirations even and unlabored.  Clear throughout to auscultation.   No wheezes, crackles, or rhonchi. No acute distress. Heart:  Regular rate and rhythm; no murmurs, clicks, rubs, or gallops. Abdomen:  Normal bowel sounds.  No bruits.  Soft, non-tender and non-distended without masses, hepatosplenomegaly or hernias noted.  No guarding or rebound tenderness.  Negative Carnett sign.   Rectal:  Deferred.  Pulses:  Normal pulses noted. Extremities:  No clubbing or edema.  No cyanosis. Neurologic:  Alert and oriented x3;  grossly normal neurologically. Skin:  Intact without significant lesions or rashes.  No jaundice. Lymph Nodes:  No significant cervical adenopathy. Psych:  Alert and cooperative. Normal mood and affect.  Imaging Studies: No results found.  Assessment and Plan:   [name] is a 60 y.o. y/o female who comes in today with a history of diverticulitis with a recent episode.  The patient was treated with antibiotics and states that her symptoms have completely resolved.  There is no report of any worry symptoms such as rectal bleeding black stools bloody stools or unexplained weight loss.  The patient is due for colonoscopy since she had polyps in 2016.  The patient will be set up for colonoscopy due to her history of colon polyps.  The patient has been explained the plan and agrees with it.    Lucilla Lame, MD. Marval Regal    Note: This dictation was prepared with Dragon dictation along with smaller phrase technology. Any transcriptional errors that result from this process are unintentional.

## 2021-04-06 ENCOUNTER — Encounter: Payer: Self-pay | Admitting: Gastroenterology

## 2021-04-06 ENCOUNTER — Other Ambulatory Visit: Payer: Self-pay

## 2021-04-06 DIAGNOSIS — Z86010 Personal history of colonic polyps: Secondary | ICD-10-CM

## 2021-04-07 ENCOUNTER — Other Ambulatory Visit: Payer: Self-pay

## 2021-04-07 MED ORDER — SUTAB 1479-225-188 MG PO TABS: 1.0000 | ORAL_TABLET | ORAL | 0 refills | Status: DC

## 2021-04-14 ENCOUNTER — Encounter: Payer: Self-pay | Admitting: Gastroenterology

## 2021-04-14 ENCOUNTER — Other Ambulatory Visit: Payer: Self-pay

## 2021-04-14 ENCOUNTER — Encounter
Admission: RE | Disposition: A | Discharge status: Home / Self Care | Payer: Self-pay | Source: Home / Self Care | Attending: Gastroenterology

## 2021-04-14 ENCOUNTER — Ambulatory Visit: Payer: Medicare HMO | Admitting: Anesthesiology

## 2021-04-14 ENCOUNTER — Ambulatory Visit
Admission: RE | Admit: 2021-04-14 | Discharge: 2021-04-14 | Disposition: A | Discharge status: Home / Self Care | Payer: Medicare HMO | Attending: Gastroenterology | Admitting: Gastroenterology

## 2021-04-14 DIAGNOSIS — Z860100 Personal history of colon polyps, unspecified: Secondary | ICD-10-CM

## 2021-04-14 DIAGNOSIS — K5730 Diverticulosis of large intestine without perforation or abscess without bleeding: Secondary | ICD-10-CM | POA: Diagnosis not present

## 2021-04-14 DIAGNOSIS — Z1211 Encounter for screening for malignant neoplasm of colon: Secondary | ICD-10-CM | POA: Diagnosis present

## 2021-04-14 DIAGNOSIS — Z87891 Personal history of nicotine dependence: Secondary | ICD-10-CM | POA: Diagnosis not present

## 2021-04-14 DIAGNOSIS — E7800 Pure hypercholesterolemia, unspecified: Secondary | ICD-10-CM | POA: Insufficient documentation

## 2021-04-14 DIAGNOSIS — K635 Polyp of colon: Secondary | ICD-10-CM | POA: Diagnosis not present

## 2021-04-14 DIAGNOSIS — I69328 Other speech and language deficits following cerebral infarction: Secondary | ICD-10-CM | POA: Insufficient documentation

## 2021-04-14 DIAGNOSIS — D123 Benign neoplasm of transverse colon: Secondary | ICD-10-CM | POA: Insufficient documentation

## 2021-04-14 HISTORY — PX: COLONOSCOPY WITH PROPOFOL: SHX5780

## 2021-04-14 SURGERY — COLONOSCOPY WITH PROPOFOL
Anesthesia: General

## 2021-04-14 MED ORDER — PROPOFOL 10 MG/ML IV BOLUS
INTRAVENOUS | Status: DC | PRN
  Administered 2021-04-14: 100 mg via INTRAVENOUS
  Administered 2021-04-14 (×2): 20 mg via INTRAVENOUS
  Administered 2021-04-14: 30 mg via INTRAVENOUS
  Administered 2021-04-14: 40 mg via INTRAVENOUS
  Administered 2021-04-14: 20 mg via INTRAVENOUS

## 2021-04-14 MED ORDER — LIDOCAINE HCL (CARDIAC) PF 100 MG/5ML IV SOSY
PREFILLED_SYRINGE | INTRAVENOUS | Status: DC | PRN
  Administered 2021-04-14: 30 mg via INTRAVENOUS

## 2021-04-14 MED ORDER — LACTATED RINGERS IV SOLN: INTRAVENOUS | Status: DC

## 2021-04-14 MED ORDER — SODIUM CHLORIDE 0.9 % IV SOLN: INTRAVENOUS | Status: DC

## 2021-04-14 MED ORDER — ACETAMINOPHEN 325 MG PO TABS: 325.0000 mg | ORAL_TABLET | ORAL | Status: DC | PRN

## 2021-04-14 MED ORDER — ONDANSETRON HCL 4 MG/2ML IJ SOLN: 4.0000 mg | Freq: Once | INTRAMUSCULAR | Status: DC | PRN

## 2021-04-14 MED ORDER — ACETAMINOPHEN 160 MG/5ML PO SOLN: 325.0000 mg | ORAL | Status: DC | PRN

## 2021-04-14 MED ORDER — STERILE WATER FOR IRRIGATION IR SOLN
Status: DC | PRN
  Administered 2021-04-14: 200 mL

## 2021-04-14 SURGICAL SUPPLY — 22 items
CLIP HMST 235XBRD CATH ROT (MISCELLANEOUS) IMPLANT
CLIP RESOLUTION 360 11X235 (MISCELLANEOUS)
ELECT REM PT RETURN 9FT ADLT (ELECTROSURGICAL)
ELECTRODE REM PT RTRN 9FT ADLT (ELECTROSURGICAL) IMPLANT
FORCEPS BIOP RAD 4 LRG CAP 4 (CUTTING FORCEPS) ×2 IMPLANT
GOWN CVR UNV OPN BCK APRN NK (MISCELLANEOUS) ×2 IMPLANT
GOWN ISOL THUMB LOOP REG UNIV (MISCELLANEOUS) ×4
INJECTOR VARIJECT VIN23 (MISCELLANEOUS) IMPLANT
KIT DEFENDO VALVE AND CONN (KITS) IMPLANT
KIT PRC NS LF DISP ENDO (KITS) ×1 IMPLANT
KIT PROCEDURE OLYMPUS (KITS) ×2
MANIFOLD NEPTUNE II (INSTRUMENTS) ×2 IMPLANT
MARKER SPOT ENDO TATTOO 5ML (MISCELLANEOUS) IMPLANT
PROBE APC STR FIRE (PROBE) IMPLANT
RETRIEVER NET ROTH 2.5X230 LF (MISCELLANEOUS) IMPLANT
SNARE COLD EXACTO (MISCELLANEOUS) IMPLANT
SNARE SHORT THROW 13M SML OVAL (MISCELLANEOUS) IMPLANT
SNARE SNG USE RND 15MM (INSTRUMENTS) IMPLANT
SPOT EX ENDOSCOPIC TATTOO (MISCELLANEOUS)
TRAP ETRAP POLY (MISCELLANEOUS) IMPLANT
VARIJECT INJECTOR VIN23 (MISCELLANEOUS)
WATER STERILE IRR 250ML POUR (IV SOLUTION) ×2 IMPLANT

## 2021-04-14 NOTE — Interval H&P Note (Signed)
Jennifer Lame, MD [address] [place], [place] [address] [phone] Fax : [phone]  Primary Care Physician:  Albina Billet, MD Primary Gastroenterologist:  Dr. Allen Norris  Pre-Procedure History & Physical: HPI:  Jennifer Oconnor is a 60 y.o. female is here for an colonoscopy.   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   Mild    Past Surgical History:  Procedure Laterality Date   ABDOMINAL HYSTERECTOMY  1993   COLON SURGERY     colectomy 9/11   COLONOSCOPY N/A 10/15/2014   Procedure: COLONOSCOPY;  Surgeon: Jennifer Lame, MD;  Location: [facility];  Service: Gastroenterology;  Laterality: N/A;  cecum time- 1003   ENDARTERECTOMY Left 06/14/2017   Procedure: ENDARTERECTOMY CAROTID;  Surgeon: Katha Cabal, MD;  Location: [facility];  Service: Vascular;  Laterality: Left;   PILONIDAL CYST DRAINAGE  02/13/12   POLYPECTOMY  10/15/2014   Procedure: POLYPECTOMY INTESTINAL;  Surgeon: Jennifer Lame, MD;  Location: [facility];  Service: Gastroenterology;;    Prior to Admission medications   Medication Sig Start Date End Date Taking? Authorizing Provider  acetaminophen (TYLENOL) 325 MG tablet Take 1-2 tablets (325-650 mg total) by mouth every 4 (four) hours as needed for mild pain (or temp >/= 101 F). 06/16/17  Yes Gouru, Illene Silver, MD  ALPRAZolam [name]) 0.5 MG tablet Take 0.5 mg by mouth 2 (two) times daily as needed for anxiety.   Yes [provider]  aspirin EC 81 MG EC tablet Take 1 tablet (81 mg total) by mouth daily. 06/17/17  Yes Gouru, Illene Silver, MD  atorvastatin (LIPITOR) 80 MG tablet Take 1 tablet (80 mg total) by mouth daily at 6 PM. 06/16/17  Yes Gouru,  Aruna, MD  buPROPion (WELLBUTRIN XL) 150 MG 24 hr tablet  01/22/20  Yes [provider]  estradiol (VIVELLE-DOT) 0.025 MG/24HR Place 1 patch onto the skin 2 (two) times a week. Wed/Sun   Yes [provider]  latanoprost (XALATAN) 0.005 % ophthalmic solution 1 drop at bedtime.   Yes [provider]  lisinopril (PRINIVIL,ZESTRIL) 5 MG tablet TK 1 T PO QD 06/24/17  Yes [provider]  sertraline (ZOLOFT) 25 MG tablet Take 25 mg (1 tablet) every morning for one week then increase to 50 mg (two tablets) every morning. 10/15/17  Yes [provider]  Sodium Sulfate-Mag Sulfate-KCl (SUTAB) [id number] MG TABS Take 1 kit by mouth as directed. 04/07/21  Yes Jennifer Lame, MD    Allergies as of 04/06/2021   (No Known Allergies)    Family History  Problem Relation Age of Onset   Heart attack Mother    Alzheimer's disease Father     Social History   Socioeconomic History   Marital status: Married    Spouse name: Not on file   Number of children: Not on file   Years of education: Not on file   Highest education level: Not on file  Occupational History   Not on file  Tobacco Use   Smoking status: Former    Packs/day: 0.50    Years: 40.00    Pack years: 20.00    Types: Cigarettes    Quit date:  06/14/2017    Years since quitting: 3.8   Smokeless tobacco: Never  Vaping Use   Vaping Use: Never used  Substance and Sexual Activity   Alcohol use: No   Drug use: No   Sexual activity: Yes    Birth control/protection: Surgical  Other Topics Concern   Not on file  Social History Narrative   Lives with husband   Has service dog that recognizes stroke symptoms.   Social Determinants of Health   Financial Resource Strain: Not on file  Food Insecurity: Not on file  Transportation Needs: Not on file  Physical Activity: Not on file  Stress: Not on file  Social Connections: Not on file  Intimate Partner Violence: Not on file    Review of  Systems: See HPI, otherwise negative ROS  Physical Exam: BP (!) 155/80   Pulse 74   Temp 97.6 F (36.4 C) (Temporal)   Resp 20   Ht 5' (1.524 m)   Wt 61.7 kg   SpO2 98%   BMI 26.56 kg/m  General:   Alert,  pleasant and cooperative in NAD Head:  Normocephalic and atraumatic. Neck:  Supple; no masses or thyromegaly. Lungs:  Clear throughout to auscultation.    Heart:  Regular rate and rhythm. Abdomen:  Soft, nontender and nondistended. Normal bowel sounds, without guarding, and without rebound.   Neurologic:  Alert and  oriented x4;  grossly normal neurologically.  Impression/Plan: Jennifer Oconnor is here for an colonoscopy to be performed for a history of adenomatous polyps on 2016   Risks, benefits, limitations, and alternatives regarding  colonoscopy have been reviewed with the patient.  Questions have been answered.  All parties agreeable.   Jennifer Lame, MD  04/14/2021, 7:28 AM

## 2021-04-14 NOTE — Transfer of Care (Signed)
Immediate Anesthesia Transfer of Care Note  Patient: Jennifer Oconnor  Procedure(s) Performed: COLONOSCOPY WITH PROPOFOL  Patient Location: PACU  Anesthesia Type: General  Level of Consciousness: awake, alert  and patient cooperative  Airway and Oxygen Therapy: Patient Spontanous Breathing and Patient connected to supplemental oxygen  Post-op Assessment: Post-op Vital signs reviewed, Patient's Cardiovascular Status Stable, Respiratory Function Stable, Patent Airway and No signs of Nausea or vomiting  Post-op Vital Signs: Reviewed and stable  Complications: No notable events documented.

## 2021-04-14 NOTE — Anesthesia Preprocedure Evaluation (Addendum)
Anesthesia Evaluation  Patient identified by MRN, date of birth, ID band Patient awake    Reviewed: Allergy & Precautions, NPO status   Airway Mallampati: II  TM Distance: >3 FB     Dental   Pulmonary former smoker,    Pulmonary exam normal        Cardiovascular hypertension,  Rhythm:Regular Rate:Normal  HLD   Neuro/Psych PSYCHIATRIC DISORDERS Anxiety Depression CVA (after carotid endarterectomy - residual memory and speech effects)    GI/Hepatic   Endo/Other    Renal/GU      Musculoskeletal   Abdominal   Peds  Hematology   Anesthesia Other Findings   Reproductive/Obstetrics                            Anesthesia Physical Anesthesia Plan  ASA: 3  Anesthesia Plan: General   Post-op Pain Management:    Induction: Intravenous  PONV Risk Score and Plan: Propofol infusion, TIVA and Treatment may vary due to age or medical condition  Airway Management Planned: Natural Airway and Nasal Cannula  Additional Equipment:   Intra-op Plan:   Post-operative Plan:   Informed Consent: I have reviewed the patients History and Physical, chart, labs and discussed the procedure including the risks, benefits and alternatives for the proposed anesthesia with the patient or authorized representative who has indicated his/her understanding and acceptance.       Plan Discussed with: CRNA  Anesthesia Plan Comments:         Anesthesia Quick Evaluation

## 2021-04-14 NOTE — Anesthesia Postprocedure Evaluation (Signed)
Anesthesia Post Note  Patient: Jennifer Oconnor  Procedure(s) Performed: COLONOSCOPY WITH PROPOFOL     Patient location during evaluation: PACU Anesthesia Type: General Level of consciousness: awake Pain management: pain level controlled Vital Signs Assessment: post-procedure vital signs reviewed and stable Respiratory status: respiratory function stable Cardiovascular status: stable Postop Assessment: no signs of nausea or vomiting Anesthetic complications: no   No notable events documented.  Veda Canning

## 2021-04-14 NOTE — Op Note (Signed)
[facility] Patient Name: Jennifer Oconnor Procedure Date: 04/14/2021 7:09 AM MRN: [id number] Account #: [id number] Date of Birth: 05-Jul-1960 Admit Type: Outpatient Age: 60 Room: [facility] OR ROOM 01 Gender: Female Note Status: Finalized Instrument Name: [id number] Procedure:             Colonoscopy Indications:           High risk colon cancer surveillance: Personal history                         of colonic polyps Providers:             Lucilla Lame MD, MD Referring MD:          Leona Carry. Hall Busing, MD (Referring MD) Medicines:             Propofol per Anesthesia Complications:         No immediate complications. Procedure:             Pre-Anesthesia Assessment:                        - Prior to the procedure, a History and Physical was                         performed, and patient medications and allergies were                         reviewed. The patient's tolerance of previous                         anesthesia was also reviewed. The risks and benefits                         of the procedure and the sedation options and risks                         were discussed with the patient. All questions were                         answered, and informed consent was obtained. Prior                         Anticoagulants: The patient has taken no previous                         anticoagulant or antiplatelet agents. ASA Grade                         Assessment: II - A patient with mild systemic disease.                         After reviewing the risks and benefits, the patient                         was deemed in satisfactory condition to undergo the                         procedure.  After obtaining informed consent, the colonoscope was                         passed under direct vision. Throughout the procedure,                         the patient's blood pressure, pulse, and oxygen                         saturations were monitored  continuously. The                         Colonoscope was introduced through the anus and                         advanced to the the cecum, identified by appendiceal                         orifice and ileocecal valve. The colonoscopy was                         performed without difficulty. The patient tolerated                         the procedure well. The quality of the bowel                         preparation was excellent. Findings:      The perianal and digital rectal examinations were normal.      A 3 mm polyp was found in the transverse colon. The polyp was sessile.       The polyp was removed with a cold biopsy forceps. Resection and       retrieval were complete.      Multiple small-mouthed diverticula were found in the left colon. Impression:            - One 3 mm polyp in the transverse colon, removed with                         a cold biopsy forceps. Resected and retrieved.                        - Diverticulosis in the left colon. Recommendation:        - Discharge patient to home.                        - Resume previous diet.                        - Continue present medications.                        - Await pathology results.                        - Repeat colonoscopy in 7 years for surveillance. Procedure Code(s):     --- Professional ---                        [phone], Colonoscopy, flexible; with biopsy,  single or                         multiple Diagnosis Code(s):     --- Professional ---                        Z86.010, Personal history of colonic polyps                        K63.5, Polyp of colon CPT copyright 2019 American Medical Association. All rights reserved. The codes documented in this report are preliminary and upon coder review may  be revised to meet current compliance requirements. Lucilla Lame MD, MD 04/14/2021 7:56:14 AM This report has been signed electronically. Number of Addenda: 0 Note Initiated On: 04/14/2021 7:09 AM Scope Withdrawal  Time: 0 hours 9 minutes 43 seconds  Total Procedure Duration: 0 hours 11 minutes 42 seconds  Estimated Blood Loss:  Estimated blood loss: none.      [facility]

## 2021-04-14 NOTE — Anesthesia Procedure Notes (Signed)
Date/Time: 04/14/2021 7:40 AM Performed by: Cameron Ali, CRNA Pre-anesthesia Checklist: Patient identified, Emergency Drugs available, Suction available, Timeout performed and Patient being monitored Patient Re-evaluated:Patient Re-evaluated prior to induction Oxygen Delivery Method: Nasal cannula Placement Confirmation: positive ETCO2

## 2021-04-17 ENCOUNTER — Encounter: Payer: Self-pay | Admitting: Gastroenterology

## 2021-04-17 LAB — SURGICAL PATHOLOGY

## 2021-04-25 ENCOUNTER — Telehealth: Payer: Self-pay | Admitting: Gastroenterology

## 2021-04-25 NOTE — Telephone Encounter (Signed)
Inbound call from pt requesting a call back stating that should would like to know if her test results had came back. Please advise.

## 2021-05-01 NOTE — Telephone Encounter (Signed)
Returned pt's call and discuss results of her colonoscopy.

## 2021-08-03 ENCOUNTER — Ambulatory Visit: Payer: Medicare HMO | Admitting: Dermatology

## 2021-08-03 ENCOUNTER — Other Ambulatory Visit: Payer: Self-pay

## 2021-08-03 DIAGNOSIS — L821 Other seborrheic keratosis: Secondary | ICD-10-CM

## 2021-08-03 DIAGNOSIS — D1800 Hemangioma unspecified site: Secondary | ICD-10-CM

## 2021-08-03 DIAGNOSIS — Z85828 Personal history of other malignant neoplasm of skin: Secondary | ICD-10-CM | POA: Diagnosis not present

## 2021-08-03 DIAGNOSIS — Z1283 Encounter for screening for malignant neoplasm of skin: Secondary | ICD-10-CM

## 2021-08-03 DIAGNOSIS — L578 Other skin changes due to chronic exposure to nonionizing radiation: Secondary | ICD-10-CM

## 2021-08-03 DIAGNOSIS — D229 Melanocytic nevi, unspecified: Secondary | ICD-10-CM

## 2021-08-03 DIAGNOSIS — L820 Inflamed seborrheic keratosis: Secondary | ICD-10-CM

## 2021-08-03 DIAGNOSIS — L720 Epidermal cyst: Secondary | ICD-10-CM

## 2021-08-03 DIAGNOSIS — Z872 Personal history of diseases of the skin and subcutaneous tissue: Secondary | ICD-10-CM

## 2021-08-03 DIAGNOSIS — L814 Other melanin hyperpigmentation: Secondary | ICD-10-CM

## 2021-08-03 NOTE — Progress Notes (Signed)
? ?Follow-Up Visit ?  ?Subjective  ?Jennifer Oconnor is a 61 y.o. female who presents for the following: Total body skin exam (Hx of BCC L medial infraclavicular, AKs). ?The patient presents for Total-Body Skin Exam (TBSE) for skin cancer screening and mole check.  The patient has spots, moles and lesions to be evaluated, some may be new or changing and the patient has concerns that these could be cancer.  ? ?The following portions of the chart were reviewed this encounter and updated as appropriate:  ? Tobacco  Allergies  Meds  Problems  Med Hx  Surg Hx  Fam Hx   ?  ?Review of Systems:  No other skin or systemic complaints except as noted in HPI or Assessment and Plan. ? ?Objective  ?Well appearing patient in no apparent distress; mood and affect are within normal limits. ? ?A full examination was performed including scalp, head, eyes, ears, nose, lips, neck, chest, axillae, abdomen, back, buttocks, bilateral upper extremities, bilateral lower extremities, hands, feet, fingers, toes, fingernails, and toenails. All findings within normal limits unless otherwise noted below. ? ?L medial infraclaviular ?Well healed scar with no evidence of recurrence.  ? ?R glabella ?Cystic pap ? ?face x 9 (9) ?Stuck on waxy paps with erythema ? ? ?Assessment & Plan  ? ?Lentigines ?- Scattered tan macules ?- Due to sun exposure ?- Benign-appearing, observe ?- Recommend daily broad spectrum sunscreen SPF 30+ to sun-exposed areas, reapply every 2 hours as needed. ?- Call for any changes ? ?Seborrheic Keratoses ?- Stuck-on, waxy, tan-brown papules and/or plaques  ?- Benign-appearing ?- Discussed benign etiology and prognosis. ?- Observe ?- Call for any changes ? ?Melanocytic Nevi ?- Tan-brown and/or pink-flesh-colored symmetric macules and papules ?- Benign appearing on exam today ?- Observation ?- Call clinic for new or changing moles ?- Recommend daily use of broad spectrum spf 30+ sunscreen to sun-exposed areas.   ? ?Hemangiomas ?- Red papules ?- Discussed benign nature ?- Observe ?- Call for any changes ? ?Actinic Damage ?- Chronic condition, secondary to cumulative UV/sun exposure ?- diffuse scaly erythematous macules with underlying dyspigmentation ?- Recommend daily broad spectrum sunscreen SPF 30+ to sun-exposed areas, reapply every 2 hours as needed.  ?- Staying in the shade or wearing long sleeves, sun glasses (UVA+UVB protection) and wide brim hats (4-inch brim around the entire circumference of the hat) are also recommended for sun protection.  ?- Call for new or changing lesions. ? ?Skin cancer screening performed today. ? ?History of PreCancerous Actinic Keratosis  ?- site(s) of PreCancerous Actinic Keratosis clear today. ?- these may recur and new lesions may form requiring treatment to prevent transformation into skin cancer ?- observe for new or changing spots and contact [facility] for appointment if occur ?- photoprotection with sun protective clothing; sunglasses and broad spectrum sunscreen with SPF of at least 30 + and frequent self skin exams recommended ?- yearly exams by a dermatologist recommended for persons with history of PreCancerous Actinic Keratoses  ? ?History of basal cell carcinoma (BCC) ?L medial infraclaviular ? ?Clear. Observe for recurrence. Call clinic for new or changing lesions.  Recommend regular skin exams, daily broad-spectrum spf 30+ sunscreen use, and photoprotection.   ? ?Epidermal cyst ?R glabella ?Benign-appearing. Exam most consistent with an epidermal inclusion cyst. Discussed that a cyst is a benign growth that can grow over time and sometimes get irritated or inflamed. Recommend observation if it is not bothersome. Discussed option of surgical excision to remove it if  it is growing, symptomatic, or other changes noted. Please call for new or changing lesions so they can be evaluated. ? ?Inflamed seborrheic keratosis (9) ?face x 9 ?Destruction of lesion - face x  9 ?Complexity: simple   ?Destruction method: cryotherapy   ?Informed consent: discussed and consent obtained   ?Timeout:  patient name, date of birth, surgical site, and procedure verified ?Lesion destroyed using liquid nitrogen: Yes   ?Region frozen until ice ball extended beyond lesion: Yes   ?Outcome: patient tolerated procedure well with no complications   ?Post-procedure details: wound care instructions given   ? ?Return in about 1 year (around 08/04/2022) for TBSE, Hx of BCC, Hx of AKs. ? ?I, Othelia Pulling, RMA, am acting as scribe for Sarina Ser, MD . ?Documentation: I have reviewed the above documentation for accuracy and completeness, and I agree with the above. ? ?Sarina Ser, MD ? ? ?

## 2021-08-03 NOTE — Patient Instructions (Addendum)

## 2021-08-04 ENCOUNTER — Encounter: Payer: Self-pay | Admitting: Dermatology

## 2021-10-11 ENCOUNTER — Ambulatory Visit: Payer: Medicare HMO | Admitting: Dermatology

## 2021-10-11 DIAGNOSIS — L720 Epidermal cyst: Secondary | ICD-10-CM

## 2021-10-11 DIAGNOSIS — D239 Other benign neoplasm of skin, unspecified: Secondary | ICD-10-CM

## 2021-10-11 DIAGNOSIS — D235 Other benign neoplasm of skin of trunk: Secondary | ICD-10-CM | POA: Diagnosis not present

## 2021-10-11 NOTE — Patient Instructions (Signed)

## 2021-10-11 NOTE — Progress Notes (Signed)
? ?  Follow-Up Visit ?  ?Subjective  ?Jennifer Oconnor is a 61 y.o. female who presents for the following: Follow-up (Patient reports sore sore black bumps at groin area that she noticed several weeks ago. Patient reports a hx have several epidermal cyst removed in area. ). ? ?The patient has spots, moles and lesions to be evaluated, some may be new or changing and the patient has concerns that these could be cancer. ? ?The following portions of the chart were reviewed this encounter and updated as appropriate:   ?  ? ?Review of Systems: No other skin or systemic complaints except as noted in HPI or Assessment and Plan. ? ? ?Objective  ?Well appearing patient in no apparent distress; mood and affect are within normal limits. ? ?A focused examination was performed including groin. Relevant physical exam findings are noted in the Assessment and Plan. ? ?right vulva ?Scattered dilated pores with open comedones at groin x 3 ? ?right vulva ?~6 mm firm blue SQ nodule at right vulva ? ? ? ?Assessment & Plan  ?Dilated pore of W[place] ?right vulva ? ?Benign, observe.  ? ?Discussed if bothersome insurance would cover to treat (extract). But if not bothersome could do cosmetic removal.  ? ?Discussed cosmetic procedure, noncovered.  $60 for 1st lesion and $15 for each additional lesion if done on the same day.  Maximum charge $350.  One touch-up treatment included no charge. Discussed risks of treatment including dyspigmentation, small scar, and/or recurrence. Recommend daily broad spectrum sunscreen SPF 30+/photoprotection to treated areas once healed. ? ?Patient deferred treatment at this time. ? ? ? ?Epidermal inclusion cyst ?right vulva ? ?Benign-appearing. Exam most consistent with an epidermal inclusion cyst. Discussed that a cyst is a benign growth that can grow over time and sometimes get irritated or inflamed. Recommend observation if it is not bothersome. Discussed option of surgical excision to remove it if it is  growing, symptomatic, or other changes noted. Please call for new or changing lesions so they can be evaluated. ? ?Patient deferred scheduling treatment at this time since not bothersome. ? ? ? ? ?Return if symptoms worsen or fail to improve, for keep follow up as scheduled . ?I, Ruthell Rummage, CMA, am acting as scribe for Brendolyn Patty, MD. ? ?Documentation: I have reviewed the above documentation for accuracy and completeness, and I agree with the above. ? ?Brendolyn Patty MD  ?

## 2022-01-11 ENCOUNTER — Ambulatory Visit (INDEPENDENT_AMBULATORY_CARE_PROVIDER_SITE_OTHER): Payer: Medicare HMO

## 2022-01-11 ENCOUNTER — Ambulatory Visit (INDEPENDENT_AMBULATORY_CARE_PROVIDER_SITE_OTHER): Payer: Medicare HMO | Admitting: Vascular Surgery

## 2022-01-11 ENCOUNTER — Encounter (INDEPENDENT_AMBULATORY_CARE_PROVIDER_SITE_OTHER): Payer: Self-pay | Admitting: Vascular Surgery

## 2022-01-11 VITALS — BP 133/75 | HR 74 | Resp 16 | Wt 142.4 lb

## 2022-01-11 DIAGNOSIS — I6523 Occlusion and stenosis of bilateral carotid arteries: Secondary | ICD-10-CM

## 2022-01-11 DIAGNOSIS — I10 Essential (primary) hypertension: Secondary | ICD-10-CM

## 2022-01-11 DIAGNOSIS — E782 Mixed hyperlipidemia: Secondary | ICD-10-CM | POA: Diagnosis not present

## 2022-01-11 DIAGNOSIS — M5412 Radiculopathy, cervical region: Secondary | ICD-10-CM

## 2022-01-11 NOTE — Progress Notes (Signed)
MRN : [id number]  Jennifer Oconnor is a 61 y.o. (1960-06-24) female who presents with chief complaint of check carotid arteries.  History of Present Illness:   The patient is seen for follow up evaluation of carotid stenosis. She is s/p left CEA on 06/14/2017.  The carotid stenosis followed by ultrasound.   The patient denies amaurosis fugax. There is no recent history of TIA symptoms or focal motor deficits. 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   The patient is taking enteric-coated aspirin 81 mg daily.   There is no history of migraine headaches. There is no history of seizures.   The patient has a history of coronary artery disease, no recent episodes of angina or shortness of breath. The patient denies PAD or claudication symptoms. There is a history of hyperlipidemia which is being treated with a statin.     Carotid Duplex done today shows RICA 74-25% and LICA widely patent s/p CEA.  No change compared to last study  No outpatient medications have been marked as taking for the 01/11/22 encounter (Appointment) with Delana Meyer, Dolores Lory, MD.    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   Mild    Past Surgical History:  Procedure Laterality Date   ABDOMINAL HYSTERECTOMY  1993   COLON SURGERY     colectomy 9/11   COLONOSCOPY N/A 10/15/2014   Procedure: COLONOSCOPY;  Surgeon: Lucilla Lame, MD;  Location: [place];  Service: Gastroenterology;  Laterality: N/A;  cecum time- 1003   COLONOSCOPY WITH PROPOFOL N/A 04/14/2021   Procedure: COLONOSCOPY WITH PROPOFOL;  Surgeon: Lucilla Lame, MD;  Location: [place];  Service: Endoscopy;   Laterality: N/A;   ENDARTERECTOMY Left 06/14/2017   Procedure: ENDARTERECTOMY CAROTID;  Surgeon: Katha Cabal, MD;  Location: [facility];  Service: Vascular;  Laterality: Left;   PILONIDAL CYST DRAINAGE  02/13/12   POLYPECTOMY  10/15/2014   Procedure: POLYPECTOMY INTESTINAL;  Surgeon: Lucilla Lame, MD;  Location: [facility];  Service: Gastroenterology;;    Social History Social History   Tobacco Use   Smoking status: Former    Packs/day: 0.50    Years: 40.00    Total pack years: 20.00    Types: Cigarettes    Quit date: 06/14/2017    Years since quitting: 4.5   Smokeless tobacco: Never  Vaping Use   Vaping Use: Never used  Substance Use Topics   Alcohol use: No   Drug use: No    Family History Family History  Problem Relation Age of Onset   Heart attack Mother    Alzheimer's disease Father     No Known Allergies   REVIEW OF SYSTEMS (Negative unless checked)  Constitutional: '[]'$ Weight loss  '[]'$ Fever  '[]'$ Chills Cardiac: '[]'$ Chest pain   '[]'$ Chest pressure   '[]'$ Palpitations   '[]'$ Shortness of breath when laying flat   '[]'$ Shortness of breath with exertion. Vascular:  '[x]'$ Pain in legs with walking   '[]'$ Pain in legs at rest  '[]'$ History of DVT   '[]'$ Phlebitis   '[]'$ Swelling in legs   '[]'$ Varicose veins   '[]'$ Non-healing ulcers Pulmonary:   '[]'$ Uses home oxygen   '[]'$   Productive cough   '[]'$ Hemoptysis   '[]'$ Wheeze  '[]'$ COPD   '[]'$ Asthma Neurologic:  '[]'$ Dizziness   '[]'$ Seizures   '[]'$ History of stroke   '[]'$ History of TIA  '[]'$ Aphasia   '[]'$ Vissual changes   '[]'$ Weakness or numbness in arm   '[]'$ Weakness or numbness in leg Musculoskeletal:   '[]'$ Joint swelling   '[]'$ Joint pain   '[]'$ Low back pain Hematologic:  '[]'$ Easy bruising  '[]'$ Easy bleeding   '[]'$ Hypercoagulable state   '[]'$ Anemic Gastrointestinal:  '[]'$ Diarrhea   '[]'$ Vomiting  '[]'$ Gastroesophageal reflux/heartburn   '[]'$ Difficulty swallowing. Genitourinary:  '[]'$ Chronic kidney disease   '[]'$ Difficult urination  '[]'$ Frequent urination   '[]'$ Blood in urine Skin:  '[]'$ Rashes   '[]'$ Ulcers   Psychological:  '[]'$ History of anxiety   '[]'$  History of major depression.  Physical Examination  There were no vitals filed for this visit. There is no height or weight on file to calculate BMI. Gen: WD/WN, NAD Head: [place]/AT, No temporalis wasting.  Ear/Nose/Throat: Hearing grossly intact, nares w/o erythema or drainage Eyes: PER, EOMI, sclera nonicteric.  Neck: Supple, no masses.  No bruit or JVD.  Pulmonary:  Good air movement, no audible wheezing, no use of accessory muscles.  Cardiac: RRR, normal S1, S2, no Murmurs. Vascular:  carotid bruit noted Vessel Right Left  Radial Palpable Palpable  Carotid  Palpable  Palpable  Subclav  Palpable Palpable  Gastrointestinal: soft, non-distended. No guarding/no peritoneal signs.  Musculoskeletal: M/S 5/5 throughout.  No visible deformity.  Neurologic: CN 2-12 intact. Pain and light touch intact in extremities.  Symmetrical.  Speech is fluent. Motor exam as listed above. Psychiatric: Judgment intact, Mood & affect appropriate for pt's clinical situation. Dermatologic: No rashes or ulcers noted.  No changes consistent with cellulitis.   CBC Lab Results  Component Value Date   WBC 10.2 06/15/2017   HGB 12.7 06/15/2017   HCT 37.8 06/15/2017   MCV 95.2 06/15/2017   PLT 274 06/15/2017    BMET    Component Value Date/Time   NA 137 06/15/2017 0402   K 4.2 06/15/2017 0402   CL 108 06/15/2017 0402   CO2 22 06/15/2017 0402   GLUCOSE 151 (H) 06/15/2017 0402   BUN 15 06/15/2017 0402   CREATININE 0.98 06/15/2017 0402   CALCIUM 8.5 (L) 06/15/2017 0402   GFRNONAA >60 06/15/2017 0402   GFRAA >60 06/15/2017 0402   CrCl cannot be calculated (Patient's most recent lab result is older than the maximum 21 days allowed.).  COAG No results found for: "INR", "PROTIME"  Radiology No results found.   Assessment/Plan 1. Bilateral carotid artery stenosis Recommend:   Given the patient's asymptomatic subcritical stenosis no further invasive  testing or surgery at this time.   Duplex ultrasound shows 40-59% right and widley patent left internal carotid .   Continue antiplatelet therapy as prescribed Continue management of CAD, HTN and Hyperlipidemia Healthy heart diet,  encouraged exercise at least 4 times per week Follow up in 12 months with duplex ultrasound and physical exam. - VAS US CAROTID; Future  2. Essential hypertension Continue antihypertensive medications as already ordered, these medications have been reviewed and there are no changes at this time.   3. Mixed hyperlipidemia Continue statin as ordered and reviewed, no changes at this time   4. Cervical radiculopathy Continue NSAID medications as already ordered, these medications have been reviewed and there are no changes at this time.  Continued activity and therapy was stressed.     Hortencia Pilar, MD  01/11/2022 9:22 AM

## 2022-01-12 ENCOUNTER — Encounter (INDEPENDENT_AMBULATORY_CARE_PROVIDER_SITE_OTHER): Payer: Self-pay | Admitting: Vascular Surgery

## 2022-08-13 ENCOUNTER — Ambulatory Visit: Payer: Medicare HMO | Admitting: Dermatology

## 2022-08-13 VITALS — BP 126/77 | HR 73

## 2022-08-13 DIAGNOSIS — D1801 Hemangioma of skin and subcutaneous tissue: Secondary | ICD-10-CM

## 2022-08-13 DIAGNOSIS — L814 Other melanin hyperpigmentation: Secondary | ICD-10-CM

## 2022-08-13 DIAGNOSIS — Z1283 Encounter for screening for malignant neoplasm of skin: Secondary | ICD-10-CM

## 2022-08-13 DIAGNOSIS — L820 Inflamed seborrheic keratosis: Secondary | ICD-10-CM

## 2022-08-13 DIAGNOSIS — L720 Epidermal cyst: Secondary | ICD-10-CM | POA: Diagnosis not present

## 2022-08-13 DIAGNOSIS — D229 Melanocytic nevi, unspecified: Secondary | ICD-10-CM

## 2022-08-13 DIAGNOSIS — L821 Other seborrheic keratosis: Secondary | ICD-10-CM

## 2022-08-13 DIAGNOSIS — D692 Other nonthrombocytopenic purpura: Secondary | ICD-10-CM

## 2022-08-13 DIAGNOSIS — Z85828 Personal history of other malignant neoplasm of skin: Secondary | ICD-10-CM

## 2022-08-13 DIAGNOSIS — L578 Other skin changes due to chronic exposure to nonionizing radiation: Secondary | ICD-10-CM | POA: Diagnosis not present

## 2022-08-13 NOTE — Progress Notes (Unsigned)
Follow-Up Visit   Subjective  Jennifer Oconnor is a 62 y.o. female who presents for the following: Annual Exam (Hx BCC - patient has noticed a new lesion in the groin that she would like checked today). The patient presents for Total-Body Skin Exam (TBSE) for skin cancer screening and mole check.  The patient has spots, moles and lesions to be evaluated, some may be new or changing and the patient has concerns that these could be cancer.  The following portions of the chart were reviewed this encounter and updated as appropriate:   Tobacco  Allergies  Meds  Problems  Med Hx  Surg Hx  Fam Hx     Review of Systems:  No other skin or systemic complaints except as noted in HPI or Assessment and Plan.  Objective  Well appearing patient in no apparent distress; mood and affect are within normal limits.  A full examination was performed including scalp, head, eyes, ears, nose, lips, neck, chest, axillae, abdomen, back, buttocks, bilateral upper extremities, bilateral lower extremities, hands, feet, fingers, toes, fingernails, and toenails. All findings within normal limits unless otherwise noted below.  Glabella, groin Subcutaneous nodule.   Groin x 2 (2) Erythematous stuck-on, waxy papule or plaque   Assessment & Plan  Epidermal inclusion cyst Glabella, groin Benign-appearing. Exam most consistent with an epidermal inclusion cyst. Discussed that a cyst is a benign growth that can grow over time and sometimes get irritated or inflamed. Recommend observation if it is not bothersome. Discussed option of surgical excision to remove it if it is growing, symptomatic, or other changes noted. Please call for new or changing lesions so they can be evaluated.  Inflamed seborrheic keratosis (2) Groin x 2 Symptomatic, irritating, patient would like treated. Destruction of lesion - Groin x 2 Complexity: simple   Destruction method: cryotherapy   Informed consent: discussed and consent  obtained   Timeout:  patient name, date of birth, surgical site, and procedure verified Lesion destroyed using liquid nitrogen: Yes   Region frozen until ice ball extended beyond lesion: Yes   Outcome: patient tolerated procedure well with no complications   Post-procedure details: wound care instructions given    Lentigines - Scattered tan macules - Due to sun exposure - Benign-appearing, observe - Recommend daily broad spectrum sunscreen SPF 30+ to sun-exposed areas, reapply every 2 hours as needed. - Call for any changes  Seborrheic Keratoses - Stuck-on, waxy, tan-brown papules and/or plaques  - Benign-appearing - Discussed benign etiology and prognosis. - Observe - Call for any changes  Melanocytic Nevi - Tan-brown and/or pink-flesh-colored symmetric macules and papules - Benign appearing on exam today - Observation - Call clinic for new or changing moles - Recommend daily use of broad spectrum spf 30+ sunscreen to sun-exposed areas.   Hemangiomas - Red papules - Discussed benign nature - Observe - Call for any changes  Actinic Damage - Chronic condition, secondary to cumulative UV/sun exposure - diffuse scaly erythematous macules with underlying dyspigmentation - Recommend daily broad spectrum sunscreen SPF 30+ to sun-exposed areas, reapply every 2 hours as needed.  - Staying in the shade or wearing long sleeves, sun glasses (UVA+UVB protection) and wide brim hats (4-inch brim around the entire circumference of the hat) are also recommended for sun protection.  - Call for new or changing lesions.  History of Basal Cell Carcinoma of the Skin - No evidence of recurrence today - Recommend regular full body skin exams - Recommend daily broad spectrum sunscreen  SPF 30+ to sun-exposed areas, reapply every 2 hours as needed.  - Call if any new or changing lesions are noted between office visits  Purpura - Chronic; persistent and recurrent.  Treatable, but not curable. -  Violaceous macules and patches - Benign - Related to trauma, age, sun damage and/or use of blood thinners, chronic use of topical and/or oral steroids - Observe - Can use OTC arnica containing moisturizer such as Dermend Bruise Formula if desired - Call for worsening or other concerns  Skin cancer screening performed today.  Return in about 1 year (around 08/13/2023) for TBSE.  Luther Redo, CMA, am acting as scribe for Sarina Ser, MD . Documentation: I have reviewed the above documentation for accuracy and completeness, and I agree with the above.  Sarina Ser, MD

## 2022-08-13 NOTE — Patient Instructions (Signed)
Due to recent changes in healthcare laws, you may see results of your pathology and/or laboratory studies on MyChart before the doctors have had a chance to review them. We understand that in some cases there may be results that are confusing or concerning to you. Please understand that not all results are received at the same time and often the doctors may need to interpret multiple results in order to provide you with the best plan of care or course of treatment. Therefore, we ask that you please give us 2 business days to thoroughly review all your results before contacting the office for clarification. Should we see a critical lab result, you will be contacted sooner.   If You Need Anything After Your Visit  If you have any questions or concerns for your doctor, please call our main line at [phone] and press option 4 to reach your doctor's medical assistant. If no one answers, please leave a voicemail as directed and we will return your call as soon as possible. Messages left after 4 pm will be answered the following business day.   You may also send us a message via MyChart. We typically respond to MyChart messages within 1-2 business days.  For prescription refills, please ask your pharmacy to contact our office. Our fax number is [phone].  If you have an urgent issue when the clinic is closed that cannot wait until the next business day, you can page your doctor at the number below.    Please note that while we do our best to be available for urgent issues outside of office hours, we are not available 24/7.   If you have an urgent issue and are unable to reach us, you may choose to seek medical care at your doctor's office, retail clinic, urgent care center, or emergency room.  If you have a medical emergency, please immediately call 911 or go to the emergency department.  Pager Numbers  - Dr. Kowalski: [phone]  - Dr. Moye: [phone]  - Dr. Stewart:  [phone]  In the event of inclement weather, please call our main line at [phone] for an update on the status of any delays or closures.  Dermatology Medication Tips: Please keep the boxes that topical medications come in in order to help keep track of the instructions about where and how to use these. Pharmacies typically print the medication instructions only on the boxes and not directly on the medication tubes.   If your medication is too expensive, please contact our office at [phone] option 4 or send us a message through MyChart.   We are unable to tell what your co-pay for medications will be in advance as this is different depending on your insurance coverage. However, we may be able to find a substitute medication at lower cost or fill out paperwork to get insurance to cover a needed medication.   If a prior authorization is required to get your medication covered by your insurance company, please allow us 1-2 business days to complete this process.  Drug prices often vary depending on where the prescription is filled and some pharmacies may offer cheaper prices.  The website www.goodrx.com contains coupons for medications through different pharmacies. The prices here do not account for what the cost may be with help from insurance (it may be cheaper with your insurance), but the website can give you the price if you did not use any insurance.  - You can print the associated coupon and take it with   your prescription to the pharmacy.  - You may also stop by our office during regular business hours and pick up a GoodRx coupon card.  - If you need your prescription sent electronically to a different pharmacy, notify our office through [facility] MyChart or by phone at [phone] option 4.     Si Usted Necesita Algo Despus de Su Visita  Tambin puede enviarnos un mensaje a travs de MyChart. Por lo general respondemos a los mensajes de MyChart en el transcurso de 1 a 2  das hbiles.  Para renovar recetas, por favor pida a su farmacia que se ponga en contacto con nuestra oficina. Nuestro nmero de fax es el [phone].  Si tiene un asunto urgente cuando la clnica est cerrada y que no puede esperar hasta el siguiente da hbil, puede llamar/localizar a su doctor(a) al nmero que aparece a continuacin.   Por favor, tenga en cuenta que aunque hacemos todo lo posible para estar disponibles para asuntos urgentes fuera del horario de oficina, no estamos disponibles las 24 horas del da, los 7 das de la semana.   Si tiene un problema urgente y no puede comunicarse con nosotros, puede optar por buscar atencin mdica  en el consultorio de su doctor(a), en una clnica privada, en un centro de atencin urgente o en una sala de emergencias.  Si tiene una emergencia mdica, por favor llame inmediatamente al 911 o vaya a la sala de emergencias.  Nmeros de bper  - Dr. Kowalski: [phone]  - Dra. Moye: [phone]  - Dra. Stewart: [phone]  En caso de inclemencias del tiempo, por favor llame a nuestra lnea principal al [phone] para una actualizacin sobre el estado de cualquier retraso o cierre.  Consejos para la medicacin en dermatologa: Por favor, guarde las cajas en las que vienen los medicamentos de uso tpico para ayudarle a seguir las instrucciones sobre dnde y cmo usarlos. Las farmacias generalmente imprimen las instrucciones del medicamento slo en las cajas y no directamente en los tubos del medicamento.   Si su medicamento es muy caro, por favor, pngase en contacto con nuestra oficina llamando al [phone] y presione la opcin 4 o envenos un mensaje a travs de MyChart.   No podemos decirle cul ser su copago por los medicamentos por adelantado ya que esto es diferente dependiendo de la cobertura de su seguro. Sin embargo, es posible que podamos encontrar un medicamento sustituto a menor costo o llenar un formulario para que el  seguro cubra el medicamento que se considera necesario.   Si se requiere una autorizacin previa para que su compaa de seguros cubra su medicamento, por favor permtanos de 1 a 2 das hbiles para completar este proceso.  Los precios de los medicamentos varan con frecuencia dependiendo del lugar de dnde se surte la receta y alguna farmacias pueden ofrecer precios ms baratos.  El sitio web www.goodrx.com tiene cupones para medicamentos de diferentes farmacias. Los precios aqu no tienen en cuenta lo que podra costar con la ayuda del seguro (puede ser ms barato con su seguro), pero el sitio web puede darle el precio si no utiliz ningn seguro.  - Puede imprimir el cupn correspondiente y llevarlo con su receta a la farmacia.  - Tambin puede pasar por nuestra oficina durante el horario de atencin regular y recoger una tarjeta de cupones de GoodRx.  - Si necesita que su receta se enve electrnicamente a una farmacia diferente, informe a nuestra oficina a travs de MyChart de [place]   o por telfono llamando al [phone] y presione la opcin 4.  

## 2022-08-15 ENCOUNTER — Encounter: Payer: Self-pay | Admitting: Dermatology

## 2023-01-11 ENCOUNTER — Other Ambulatory Visit (INDEPENDENT_AMBULATORY_CARE_PROVIDER_SITE_OTHER): Payer: Self-pay | Admitting: Vascular Surgery

## 2023-01-11 DIAGNOSIS — I6523 Occlusion and stenosis of bilateral carotid arteries: Secondary | ICD-10-CM

## 2023-01-12 NOTE — Progress Notes (Unsigned)
MRN : [id number]  Jennifer Oconnor is a 62 y.o. (Oct 20, 1960) female who presents with chief complaint of check carotid arteries.  History of Present Illness:   The patient is seen for follow up evaluation of carotid stenosis. She is s/p left CEA on 06/14/2017.  The carotid stenosis followed by ultrasound.   The patient denies amaurosis fugax. There is no recent history of TIA symptoms or focal motor deficits. 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   The patient is taking enteric-coated aspirin 81 mg daily.   There is no history of migraine headaches. There is no history of seizures.   The patient has a history of coronary artery disease, no recent episodes of angina or shortness of breath. The patient denies PAD or claudication symptoms. There is a history of hyperlipidemia which is being treated with a statin.     Carotid Duplex done today shows RICA 40-59% and LICA widely patent s/p CEA.  No change compared to last study  No outpatient medications have been marked as taking for the 01/14/23 encounter (Appointment) with Gilda Crease, Latina Craver, MD.    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   Mild    Past Surgical History:  Procedure Laterality Date   ABDOMINAL HYSTERECTOMY  1993   COLON SURGERY     colectomy 9/11   COLONOSCOPY N/A 10/15/2014   Procedure: COLONOSCOPY;  Surgeon: Midge Minium, MD;  Location: [facility];  Service: Gastroenterology;  Laterality: N/A;  cecum time- 1003   COLONOSCOPY WITH PROPOFOL N/A 04/14/2021   Procedure: COLONOSCOPY WITH PROPOFOL;  Surgeon: Midge Minium, MD;  Location: [facility];   Service: Endoscopy;  Laterality: N/A;   ENDARTERECTOMY Left 06/14/2017   Procedure: ENDARTERECTOMY CAROTID;  Surgeon: Renford Dills, MD;  Location: [facility];  Service: Vascular;  Laterality: Left;   PILONIDAL CYST DRAINAGE  02/13/12   POLYPECTOMY  10/15/2014   Procedure: POLYPECTOMY INTESTINAL;  Surgeon: Midge Minium, MD;  Location: [facility];  Service: Gastroenterology;;    Social History Social History   Tobacco Use   Smoking status: Former    Current packs/day: 0.00    Average packs/day: 0.5 packs/day for 40.0 years (20.0 ttl pk-yrs)    Types: Cigarettes    Start date: 06/14/1977    Quit date: 06/14/2017    Years since quitting: 5.5   Smokeless tobacco: Never  Vaping Use   Vaping status: Never Used  Substance Use Topics   Alcohol use: No   Drug use: No    Family History Family History  Problem Relation Age of Onset   Heart attack Mother    Alzheimer's disease Father     No Known Allergies   REVIEW OF SYSTEMS (Negative unless checked)  Constitutional: [] Weight loss  [] Fever  [] Chills Cardiac: [] Chest pain   [] Chest pressure   [] Palpitations   [] Shortness of breath when laying flat   [] Shortness of breath with exertion. Vascular:  [x] Pain in legs with walking   [] Pain in legs at  rest  [] History of DVT   [] Phlebitis   [] Swelling in legs   [] Varicose veins   [] Non-healing ulcers Pulmonary:   [] Uses home oxygen   [] Productive cough   [] Hemoptysis   [] Wheeze  [] COPD   [] Asthma Neurologic:  [] Dizziness   [] Seizures   [x] History of stroke   [] History of TIA  [] Aphasia   [] Vissual changes   [x] Weakness or numbness in arm   [] Weakness or numbness in leg Musculoskeletal:   [] Joint swelling   [x] Joint pain   [] Low back pain Hematologic:  [] Easy bruising  [] Easy bleeding   [] Hypercoagulable state   [] Anemic Gastrointestinal:  [] Diarrhea   [] Vomiting  [] Gastroesophageal reflux/heartburn   [] Difficulty swallowing. Genitourinary:  [] Chronic kidney disease   [] Difficult  urination  [] Frequent urination   [] Blood in urine Skin:  [] Rashes   [] Ulcers  Psychological:  [x] History of anxiety   []  History of major depression.  Physical Examination  There were no vitals filed for this visit. There is no height or weight on file to calculate BMI. Gen: WD/WN, NAD Head: [place]/AT, No temporalis wasting.  Ear/Nose/Throat: Hearing grossly intact, nares w/o erythema or drainage Eyes: PER, EOMI, sclera nonicteric.  Neck: Supple, no masses.  No bruit or JVD.  Pulmonary:  Good air movement, no audible wheezing, no use of accessory muscles.  Cardiac: RRR, normal S1, S2, no Murmurs. Vascular:  carotid bruit noted Vessel Right Left  Radial Palpable Palpable  Carotid  Palpable  Palpable  Subclav  Palpable Palpable  Gastrointestinal: soft, non-distended. No guarding/no peritoneal signs.  Musculoskeletal: M/S 5/5 throughout.  No visible deformity.  Neurologic: CN 2-12 intact. Pain and light touch intact in extremities.  Symmetrical.  Speech is fluent. Motor exam as listed above. Psychiatric: Judgment intact, Mood & affect appropriate for pt's clinical situation. Dermatologic: No rashes or ulcers noted.  No changes consistent with cellulitis.   CBC Lab Results  Component Value Date   WBC 10.2 06/15/2017   HGB 12.7 06/15/2017   HCT 37.8 06/15/2017   MCV 95.2 06/15/2017   PLT 274 06/15/2017    BMET    Component Value Date/Time   NA 137 06/15/2017 0402   K 4.2 06/15/2017 0402   CL 108 06/15/2017 0402   CO2 22 06/15/2017 0402   GLUCOSE 151 (H) 06/15/2017 0402   BUN 15 06/15/2017 0402   CREATININE 0.98 06/15/2017 0402   CALCIUM 8.5 (L) 06/15/2017 0402   GFRNONAA >60 06/15/2017 0402   GFRAA >60 06/15/2017 0402   CrCl cannot be calculated (Patient's most recent lab result is older than the maximum 21 days allowed.).  COAG No results found for: "INR", "PROTIME"  Radiology No results found.   Assessment/Plan There are no diagnoses linked to this  encounter.   Levora Dredge, MD  01/12/2023 1:59 PM

## 2023-01-14 ENCOUNTER — Ambulatory Visit (INDEPENDENT_AMBULATORY_CARE_PROVIDER_SITE_OTHER): Payer: Medicare HMO | Admitting: Vascular Surgery

## 2023-01-14 ENCOUNTER — Encounter (INDEPENDENT_AMBULATORY_CARE_PROVIDER_SITE_OTHER): Payer: Self-pay | Admitting: Vascular Surgery

## 2023-01-14 ENCOUNTER — Ambulatory Visit (INDEPENDENT_AMBULATORY_CARE_PROVIDER_SITE_OTHER): Payer: Medicare HMO

## 2023-01-14 VITALS — BP 111/67 | HR 75 | Resp 18 | Ht 60.0 in | Wt 142.6 lb

## 2023-01-14 DIAGNOSIS — I10 Essential (primary) hypertension: Secondary | ICD-10-CM | POA: Diagnosis not present

## 2023-01-14 DIAGNOSIS — M5412 Radiculopathy, cervical region: Secondary | ICD-10-CM

## 2023-01-14 DIAGNOSIS — I6523 Occlusion and stenosis of bilateral carotid arteries: Secondary | ICD-10-CM | POA: Diagnosis not present

## 2023-01-14 DIAGNOSIS — E782 Mixed hyperlipidemia: Secondary | ICD-10-CM | POA: Diagnosis not present

## 2023-01-23 ENCOUNTER — Encounter: Payer: Self-pay | Admitting: *Deleted

## 2023-01-24 ENCOUNTER — Ambulatory Visit: Payer: Medicare HMO | Attending: Cardiology | Admitting: Cardiology

## 2023-01-24 ENCOUNTER — Encounter: Payer: Self-pay | Admitting: Cardiology

## 2023-01-24 VITALS — BP 132/74 | HR 68 | Ht 60.0 in | Wt 141.0 lb

## 2023-01-24 DIAGNOSIS — E782 Mixed hyperlipidemia: Secondary | ICD-10-CM

## 2023-01-24 DIAGNOSIS — I10 Essential (primary) hypertension: Secondary | ICD-10-CM

## 2023-01-24 DIAGNOSIS — R072 Precordial pain: Secondary | ICD-10-CM

## 2023-01-24 MED ORDER — METOPROLOL TARTRATE 100 MG PO TABS: 100.0000 mg | ORAL_TABLET | Freq: Once | ORAL | 0 refills | Status: DC

## 2023-01-24 NOTE — Progress Notes (Signed)
Cardiology Office Note:    Date:  01/24/2023   ID:  [name], [name] [birth year], MRN [id number]  PCP:  Jaclyn Shaggy, MD   [facility] Providers Cardiologist:  Debbe Odea, MD     Referring MD: Jaclyn Shaggy, MD   Chief Complaint  Patient presents with   New Patient (Initial Visit)    Referred for cardiac evaluation of angina.  Patient reports mid chest pain for 2-3 months.  No cardiac history.    History of Present Illness:    Jennifer Oconnor is a 62 y.o. female with a hx of hyperlipidemia, hypertension, CVA 2019, carotid stenosis s/p left CEA 2019, former smoker x 30+ years, who presents with chest pain.  States having chest pain associated with stress or exertion over the past 2 to 3 months.  Symptoms seem to have worsened in severity.  Describes discomfort as pressure-like located in the central portion of her chest.  Denies edema, denies palpitations.  Former smoker x 30 years, stopped after she developed a stroke in 2019.  Underwent CEA at the time.  Mother had a heart attack in her 51s.  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   Mild    Past Surgical History:  Procedure Laterality Date   ABDOMINAL HYSTERECTOMY  1993   COLON SURGERY     colectomy 9/11   COLONOSCOPY N/A 10/15/2014   Procedure: COLONOSCOPY;  Surgeon: Midge Minium, MD;  Location: [facility];  Service: Gastroenterology;  Laterality: N/A;  cecum time- 1003   COLONOSCOPY WITH PROPOFOL N/A 04/14/2021   Procedure: COLONOSCOPY WITH PROPOFOL;  Surgeon: Midge Minium, MD;  Location: [facility];  Service: Endoscopy;  Laterality: N/A;    ENDARTERECTOMY Left 06/14/2017   Procedure: ENDARTERECTOMY CAROTID;  Surgeon: Renford Dills, MD;  Location: [facility];  Service: Vascular;  Laterality: Left;   PILONIDAL CYST DRAINAGE  02/13/12   POLYPECTOMY  10/15/2014   Procedure: POLYPECTOMY INTESTINAL;  Surgeon: Midge Minium, MD;  Location: [facility];  Service: Gastroenterology;;    Current Medications: Current Meds  Medication Sig   acetaminophen (TYLENOL) 325 MG tablet Take 1-2 tablets (325-650 mg total) by mouth every 4 (four) hours as needed for mild pain (or temp >/= 101 F).   ALPRAZolam (XANAX) 0.5 MG tablet Take 0.5 mg by mouth 2 (two) times daily as needed for anxiety.   aspirin EC 81 MG EC tablet Take 1 tablet (81 mg total) by mouth daily.   atorvastatin (LIPITOR) 80 MG tablet Take 1 tablet (80 mg total) by mouth daily at 6 PM.   buPROPion (WELLBUTRIN XL) 150 MG 24 hr tablet    estradiol (VIVELLE-DOT) 0.025 MG/24HR Place 1 patch onto the skin 2 (two) times a week. Wed/Sun   latanoprost (XALATAN) 0.005 % ophthalmic solution 1 drop at bedtime.   lisinopril (PRINIVIL,ZESTRIL) 5 MG tablet TK 1 T PO QD   metoprolol tartrate (LOPRESSOR) 100 MG tablet Take 1 tablet (100 mg total) by mouth once for 1 dose. TWO HOURS PRIOR TO CARDIAC CT   nitroGLYCERIN (NITROSTAT) 0.4 MG SL tablet Place 0.4 mg under the tongue  every 5 (five) minutes as needed.   sertraline (ZOLOFT) 25 MG tablet Take 25 mg (1 tablet) every morning for one week then increase to 50 mg (two tablets) every morning.     Allergies:   Patient has no known allergies.   Social History   Socioeconomic History   Marital status: Married    Spouse name: Not on file   Number of children: Not on file   Years of education: Not on file   Highest education level: Not on file  Occupational History   Not on file  Tobacco Use   Smoking status: Former    Current packs/day: 0.00    Average packs/day: 0.5 packs/day for 40.0 years (20.0 ttl pk-yrs)    Types: Cigarettes     Start date: 06/14/1977    Quit date: 06/14/2017    Years since quitting: 5.6   Smokeless tobacco: Never  Vaping Use   Vaping status: Never Used  Substance and Sexual Activity   Alcohol use: No   Drug use: No   Sexual activity: Yes    Birth control/protection: Surgical  Other Topics Concern   Not on file  Social History Narrative   Lives with husband   Has service dog that recognizes stroke symptoms.   Social Determinants of Health   Financial Resource Strain: Low Risk  (06/11/2017)   Overall Financial Resource Strain (CARDIA)    Difficulty of Paying Living Expenses: Not hard at all  Food Insecurity: No Food Insecurity (06/11/2017)   Hunger Vital Sign    Worried About Running Out of Food in the Last Year: Never true    Ran Out of Food in the Last Year: Never true  Transportation Needs: No Transportation Needs (06/11/2017)   PRAPARE - Administrator, Civil Service (Medical): No    Lack of Transportation (Non-Medical): No  Physical Activity: Insufficiently Active (06/11/2017)   Exercise Vital Sign    Days of Exercise per Week: 2 days    Minutes of Exercise per Session: 10 min  Stress: No Stress Concern Present (06/11/2017)   [employer] of Occupational Health - Occupational Stress Questionnaire    Feeling of Stress : Only a little  Social Connections: Socially Integrated (06/11/2017)   Social Connection and Isolation Panel [NHANES]    Frequency of Communication with Friends and Family: More than three times a week    Frequency of Social Gatherings with Friends and Family: More than three times a week    Attends Religious Services: More than 4 times per year    Active Member of [employer] or Organizations: Yes    Attends Engineer, structural: More than 4 times per year    Marital Status: Married     Family History: The patient's family history includes Alzheimer's disease in her father; Heart attack in her mother.  ROS:   Please see the history of present  illness.     All other systems reviewed and are negative.  EKGs/Labs/Other Studies Reviewed:    The following studies were reviewed today:  EKG Interpretation Date/Time:  Thursday January 24 2023 10:44:33 EDT Ventricular Rate:  68 PR Interval:  138 QRS Duration:  88 QT Interval:  394 QTC Calculation: 418 R Axis:   61  Text Interpretation: Normal sinus rhythm Normal ECG Confirmed by Debbe Odea ([id number]) on 01/24/2023 10:51:34 AM    Recent Labs: No results found for requested labs within last 365 days.  Recent Lipid Panel    Component Value Date/Time  CHOL 247 (H) 06/12/2017 0604   TRIG 755 (H) 06/12/2017 0604   HDL 32 (L) 06/12/2017 0604   CHOLHDL 7.7 06/12/2017 0604   VLDL UNABLE TO CALCULATE IF TRIGLYCERIDE OVER 400 mg/dL 21/30/8657 8469   LDLCALC UNABLE TO CALCULATE IF TRIGLYCERIDE OVER 400 mg/dL 62/95/2841 3244     Risk Assessment/Calculations:             Physical Exam:    VS:  BP 132/74 (BP Location: Left Arm, Patient Position: Sitting, Cuff Size: Normal)   Pulse 68   Ht 5' (1.524 m)   Wt 141 lb (64 kg)   SpO2 98%   BMI 27.54 kg/m     Wt Readings from Last 3 Encounters:  01/24/23 141 lb (64 kg)  01/14/23 142 lb 9.6 oz (64.7 kg)  01/11/22 142 lb 6.4 oz (64.6 kg)     GEN:  Well nourished, well developed in no acute distress HEENT: Normal NECK: No JVD; No carotid bruits CARDIAC: RRR, no murmurs, rubs, gallops RESPIRATORY:  Clear to auscultation without rales, wheezing or rhonchi  ABDOMEN: Soft, non-tender, non-distended MUSCULOSKELETAL:  No edema; No deformity  SKIN: Warm and dry NEUROLOGIC:  Alert and oriented x 3 PSYCHIATRIC:  Normal affect   ASSESSMENT:    1. Precordial pain   2. Mixed hyperlipidemia   3. Primary hypertension    PLAN:    In order of problems listed above:  Chest pain, consistent with angina.  Several risk factors.  Obtain echo, get coronary CT. Hyperlipidemia, continue Lipitor 80 mg daily, aspirin 81. Hypertension,  lisinopril 5 mg daily.  Follow-up after cardiac testing.     Medication Adjustments/Labs and Tests Ordered: Current medicines are reviewed at length with the patient today.  Concerns regarding medicines are outlined above.  Orders Placed This Encounter  Procedures   CT CORONARY MORPH W/CTA COR W/SCORE W/CA W/CM &/OR WO/CM   Basic Metabolic Panel (BMET)   EKG 12-Lead   ECHOCARDIOGRAM COMPLETE   Meds ordered this encounter  Medications   metoprolol tartrate (LOPRESSOR) 100 MG tablet    Sig: Take 1 tablet (100 mg total) by mouth once for 1 dose. TWO HOURS PRIOR TO CARDIAC CT    Dispense:  1 tablet    Refill:  0    Patient Instructions  Medication Instructions:   Your physician recommends that you continue on your current medications as directed. Please refer to the Current Medication list given to you today.  *If you need a refill on your cardiac medications before your next appointment, please call your pharmacy*   Lab Work:  Your physician recommends you have labs today - BMP   If you have labs (blood work) drawn today and your tests are completely normal, you will receive your results only by: MyChart Message (if you have MyChart) OR A paper copy in the mail If you have any lab test that is abnormal or we need to change your treatment, we will call you to review the results.   Testing/Procedures:  Your physician has requested that you have an echocardiogram. Echocardiography is a painless test that uses sound waves to create images of your heart. It provides your doctor with information about the size and shape of your heart and how well your heart's chambers and valves are working. This procedure takes approximately one hour. There are no restrictions for this procedure. Please do NOT wear cologne, perfume, aftershave, or lotions (deodorant is allowed). Please arrive 15 minutes prior to your appointment time.  Your cardiac CT will be scheduled at one of the below  locations:   [facility] [address] [place], [place] [address] [phone]  OR   [facility] and Vascular entrance [address] [place], [place] [address] [phone]   Please follow these instructions carefully (unless otherwise directed):  An IV will be required for this test and Nitroglycerin will be given.  Hold all erectile dysfunction medications at least 3 days (72 hrs) prior to test. (Ie viagra, cialis, sildenafil, tadalafil, etc)   On the Night Before the Test: Be sure to Drink plenty of water. Do not consume any caffeinated/decaffeinated beverages or chocolate 12 hours prior to your test. Do not take any antihistamines 12 hours prior to your test.  On the Day of the Test: Drink plenty of water until 1 hour prior to the test. Do not eat any food 1 hour prior to test. You may take your regular medications prior to the test.  Take metoprolol (Lopressor) two hours prior to test. FEMALES- please wear underwire-free bra if available, avoid dresses & tight clothing  After the Test: Drink plenty of water. After receiving IV contrast, you may experience a mild flushed feeling. This is normal. On occasion, you may experience a mild rash up to 24 hours after the test. This is not dangerous. If this occurs, you can take Benadryl 25 mg and increase your fluid intake. If you experience trouble breathing, this can be serious. If it is severe call 911 IMMEDIATELY. If it is mild, please call our office. If you take any of these medications: Glipizide/Metformin, Avandament, Glucavance, please do not take 48 hours after completing test unless otherwise instructed.  We will call to schedule your test 2-4 weeks out understanding that some insurance companies will need an authorization prior to the service being performed.   For more information and frequently asked questions, please visit our website :  [email or web address]  For non-scheduling related questions, please contact the cardiac imaging nurse navigator should you have any questions/concerns: Cardiac Imaging Nurse Navigators Direct Office Dial: [phone]   For scheduling needs, including cancellations and rescheduling, please call [place], [phone].   Follow-Up: At [facility], you and your health needs are our priority.  As part of our continuing mission to provide you with exceptional heart care, we have created designated Provider Care Teams.  These Care Teams include your primary Cardiologist (physician) and Advanced Practice Providers (APPs -  Physician Assistants and Nurse Practitioners) who all work together to provide you with the care you need, when you need it.  We recommend signing up for the patient portal called "MyChart".  Sign up information is provided on this After Visit Summary.  MyChart is used to connect with patients for Virtual Visits (Telemedicine).  Patients are able to view lab/test results, encounter notes, upcoming appointments, etc.  Non-urgent messages can be sent to your provider as well.   To learn more about what you can do with MyChart, go to [email or web address].    Your next appointment:    After Cardiac Testing  Provider:   You may see Debbe Odea, MD or one of the following Advanced Practice Providers on your designated Care Team:   Nicolasa Ducking, NP Eula Listen, PA-C Cadence Fransico Michael, PA-C Charlsie Quest, NP   Signed, Debbe Odea, MD  01/24/2023 11:52 AM    [facility]

## 2023-01-24 NOTE — Patient Instructions (Signed)
Medication Instructions:   Your physician recommends that you continue on your current medications as directed. Please refer to the Current Medication list given to you today.  *If you need a refill on your cardiac medications before your next appointment, please call your pharmacy*   Lab Work:  Your physician recommends you have labs today - BMP   If you have labs (blood work) drawn today and your tests are completely normal, you will receive your results only by: MyChart Message (if you have MyChart) OR A paper copy in the mail If you have any lab test that is abnormal or we need to change your treatment, we will call you to review the results.   Testing/Procedures:  Your physician has requested that you have an echocardiogram. Echocardiography is a painless test that uses sound waves to create images of your heart. It provides your doctor with information about the size and shape of your heart and how well your heart's chambers and valves are working. This procedure takes approximately one hour. There are no restrictions for this procedure. Please do NOT wear cologne, perfume, aftershave, or lotions (deodorant is allowed). Please arrive 15 minutes prior to your appointment time.    Your cardiac CT will be scheduled at one of the below locations:   [facility] [address] [place], [place] [address] [phone]  OR   [facility] Heart and Vascular entrance [address] [place], [place] [address] [phone]   Please follow these instructions carefully (unless otherwise directed):  An IV will be required for this test and Nitroglycerin will be given.  Hold all erectile dysfunction medications at least 3 days (72 hrs) prior to test. (Ie viagra, cialis, sildenafil, tadalafil, etc)   On the Night Before the Test: Be sure to Drink plenty of water. Do not consume any caffeinated/decaffeinated beverages or chocolate 12 hours  prior to your test. Do not take any antihistamines 12 hours prior to your test.  On the Day of the Test: Drink plenty of water until 1 hour prior to the test. Do not eat any food 1 hour prior to test. You may take your regular medications prior to the test.  Take metoprolol (Lopressor) two hours prior to test. FEMALES- please wear underwire-free bra if available, avoid dresses & tight clothing  After the Test: Drink plenty of water. After receiving IV contrast, you may experience a mild flushed feeling. This is normal. On occasion, you may experience a mild rash up to 24 hours after the test. This is not dangerous. If this occurs, you can take Benadryl 25 mg and increase your fluid intake. If you experience trouble breathing, this can be serious. If it is severe call 911 IMMEDIATELY. If it is mild, please call our office. If you take any of these medications: Glipizide/Metformin, Avandament, Glucavance, please do not take 48 hours after completing test unless otherwise instructed.  We will call to schedule your test 2-4 weeks out understanding that some insurance companies will need an authorization prior to the service being performed.   For more information and frequently asked questions, please visit our website : [email or web address]  For non-scheduling related questions, please contact the cardiac imaging nurse navigator should you have any questions/concerns: Cardiac Imaging Nurse Navigators Direct Office Dial: [phone]   For scheduling needs, including cancellations and rescheduling, please call [place], [phone].   Follow-Up: At [facility], you and your health needs are our priority.  As part of our  continuing mission to provide you with exceptional heart care, we have created designated Provider Care Teams.  These Care Teams include your primary Cardiologist (physician) and Advanced Practice Providers (APPs -  Physician Assistants and Nurse  Practitioners) who all work together to provide you with the care you need, when you need it.  We recommend signing up for the patient portal called "MyChart".  Sign up information is provided on this After Visit Summary.  MyChart is used to connect with patients for Virtual Visits (Telemedicine).  Patients are able to view lab/test results, encounter notes, upcoming appointments, etc.  Non-urgent messages can be sent to your provider as well.   To learn more about what you can do with MyChart, go to [email or web address].    Your next appointment:    After Cardiac Testing  Provider:   You may see Debbe Odea, MD or one of the following Advanced Practice Providers on your designated Care Team:   Nicolasa Ducking, NP Eula Listen, PA-C Cadence Fransico Michael, PA-C Charlsie Quest, NP

## 2023-01-25 LAB — BASIC METABOLIC PANEL
BUN/Creatinine Ratio: 16 (ref 12–28)
BUN: 13 mg/dL (ref 8–27)
CO2: 25 mmol/L (ref 20–29)
Calcium: 9.3 mg/dL (ref 8.7–10.3)
Chloride: 107 mmol/L — ABNORMAL HIGH (ref 96–106)
Creatinine, Ser: 0.83 mg/dL (ref 0.57–1.00)
Glucose: 82 mg/dL (ref 70–99)
Potassium: 4.7 mmol/L (ref 3.5–5.2)
Sodium: 143 mmol/L (ref 134–144)
eGFR: 79 mL/min/{1.73_m2} (ref >59)

## 2023-01-31 ENCOUNTER — Ambulatory Visit: Admission: RE | Admit: 2023-01-31 | Payer: Medicare HMO | Source: Ambulatory Visit

## 2023-02-07 ENCOUNTER — Ambulatory Visit: Admission: RE | Admit: 2023-02-07 | Payer: Medicare HMO | Source: Ambulatory Visit

## 2023-02-20 ENCOUNTER — Ambulatory Visit: Payer: Medicare HMO | Attending: Cardiology

## 2023-02-20 DIAGNOSIS — R072 Precordial pain: Secondary | ICD-10-CM | POA: Diagnosis not present

## 2023-02-20 LAB — ECHOCARDIOGRAM COMPLETE
Area-P 1/2: 4.39 cm2
S' Lateral: 3 cm

## 2023-02-26 ENCOUNTER — Telehealth (HOSPITAL_COMMUNITY): Payer: Self-pay | Admitting: *Deleted

## 2023-02-26 NOTE — Telephone Encounter (Signed)
Reaching out to patient to offer assistance regarding upcoming cardiac imaging study; pt verbalizes understanding of appt date/time, parking situation and where to check in, pre-test NPO status and medications ordered, and verified current allergies; name and call back number provided for further questions should they arise ? ?Larey Brick RN Navigator Cardiac Imaging ?[facility] ?[phone] office ?[phone] cell ? ?Patient to take 100mg  metoprolol tartrate two hours prior to her cardiac CT scan.  ?

## 2023-02-26 NOTE — Progress Notes (Deleted)
Cardiology Office Note:  .   Date:  02/26/2023  ID:  Jennifer Oconnor, DOB [birth year], MRN [id number] PCP: Jaclyn Shaggy, MD  [facility] Providers Cardiologist:  Debbe Odea, MD { Click to update primary MD,subspecialty MD or APP then REFRESH:1}   History of Present Illness: .   Jennifer Oconnor is a 62 y.o. female with a past medical history of hyperlipidemia, hypertension, CVA (2019), carotid stenosis status post left TCAR (2019), former smoker x 30+ years, who presents today for follow-up.  Previous echocardiogram completed in 2019 revealed an LVEF of 60 to 65%, no RWMA, and no significant valvular abnormalities.  Repeat echocardiogram revealed an LVEF of 60 to 65%, no RWMA, G1 DD, and mild to moderate TR, mild to moderate MR. Coronary CTA was ordered and pending.  She was last seen in clinic 01/24/2023 by Dr.Agbor-Etang with complaints of chest pain associated with stress or exertion over the last 2 to 3 months.  Symptoms seem to have worsened in severity.  She describes discomfort as a pressure-like located in the central portion of her chest.  She denied any other associated symptoms.  She was scheduled for repeat echocardiogram and a coronary CTA.  She returns to clinic today  ROS: 10 point review of systems has been reviewed and considered negative with exception of what is been listed in the HPI  Studies Reviewed: Marland Kitchen       TTE 02/20/23 1. Left ventricular ejection fraction, by estimation, is 60 to 65%. The  left ventricle has normal function. The left ventricle has no regional  wall motion abnormalities. Left ventricular diastolic parameters are  consistent with Grade I diastolic  dysfunction (impaired relaxation). The average left ventricular global  longitudinal strain is -22.0 %.   2. Right ventricular systolic function is normal. The right ventricular  size is normal. There is normal pulmonary artery systolic pressure. The  estimated right ventricular  systolic pressure is 26.3 mmHg.   3. The mitral valve is normal in structure. Mild to moderate mitral valve  regurgitation. No evidence of mitral stenosis.   4. Tricuspid valve regurgitation is mild to moderate.   5. The aortic valve is tricuspid. Aortic valve regurgitation is not  visualized. No aortic stenosis is present.   6. The inferior vena cava is normal in size with greater than 50%  respiratory variability, suggesting right atrial pressure of 3 mmHg.    TTE 06/12/2017 Left ventricle: The cavity size was normal. Wall thickness was    normal. Systolic function was normal. The estimated ejection    fraction was in the range of 60% to 65%. Wall motion was normal;    there were no regional wall motion abnormalities. Left    ventricular diastolic function parameters were normal for the    patient&'s age.  - Right ventricle: The cavity size was normal. Wall thickness was    normal. Systolic function was normal.  - No significant valvular abnormality.  Risk Assessment/Calculations:     No BP recorded.  {Refresh Note OR Click here to enter BP  :1}***       Physical Exam:   VS:  There were no vitals taken for this visit.   Wt Readings from Last 3 Encounters:  01/24/23 141 lb (64 kg)  01/14/23 142 lb 9.6 oz (64.7 kg)  01/11/22 142 lb 6.4 oz (64.6 kg)    GEN: Well nourished, well developed in no acute distress NECK: No JVD; No carotid bruits CARDIAC: ***RRR, no  murmurs, rubs, gallops RESPIRATORY:  Clear to auscultation without rales, wheezing or rhonchi  ABDOMEN: Soft, non-tender, non-distended EXTREMITIES:  No edema; No deformity   ASSESSMENT AND PLAN: .   ***    {Are you ordering a CV Procedure (e.g. stress test, cath, DCCV, TEE, etc)?   Press F2        :[id number]}  Dispo: ***  Signed, Breianna Delfino, NP

## 2023-02-28 ENCOUNTER — Other Ambulatory Visit: Payer: Self-pay | Admitting: Cardiology

## 2023-02-28 ENCOUNTER — Ambulatory Visit
Admission: RE | Admit: 2023-02-28 | Discharge: 2023-02-28 | Disposition: A | Discharge status: Home / Self Care | Payer: Medicare HMO | Source: Ambulatory Visit | Attending: Cardiology | Admitting: Cardiology

## 2023-02-28 DIAGNOSIS — R072 Precordial pain: Secondary | ICD-10-CM | POA: Diagnosis present

## 2023-02-28 DIAGNOSIS — R931 Abnormal findings on diagnostic imaging of heart and coronary circulation: Secondary | ICD-10-CM

## 2023-02-28 DIAGNOSIS — I2510 Atherosclerotic heart disease of native coronary artery without angina pectoris: Secondary | ICD-10-CM | POA: Diagnosis not present

## 2023-02-28 MED ORDER — SODIUM CHLORIDE 0.9 % IV BOLUS
150.0000 mL | Freq: Once | INTRAVENOUS | Status: AC
  Administered 2023-02-28: 150 mL via INTRAVENOUS

## 2023-02-28 MED ORDER — NITROGLYCERIN 0.4 MG SL SUBL
0.8000 mg | SUBLINGUAL_TABLET | Freq: Once | SUBLINGUAL | Status: AC
  Administered 2023-02-28: 0.8 mg via SUBLINGUAL

## 2023-02-28 MED ORDER — IOHEXOL 350 MG/ML SOLN
75.0000 mL | Freq: Once | INTRAVENOUS | Status: AC | PRN
  Administered 2023-02-28: 75 mL via INTRAVENOUS

## 2023-02-28 NOTE — Progress Notes (Signed)
Patient tolerated CT well. Drank water after. Vital signs stable encourage to drink water throughout day.Reasons explained and verbalized understanding. Ambulated steady gait.

## 2023-03-01 ENCOUNTER — Ambulatory Visit: Payer: Medicare HMO | Admitting: Cardiology

## 2023-03-04 ENCOUNTER — Encounter: Payer: Self-pay | Admitting: Cardiology

## 2023-03-04 ENCOUNTER — Ambulatory Visit: Payer: Medicare HMO | Attending: Cardiology | Admitting: Cardiology

## 2023-03-04 VITALS — BP 122/78 | HR 74 | Ht 60.0 in | Wt 134.6 lb

## 2023-03-04 DIAGNOSIS — R072 Precordial pain: Secondary | ICD-10-CM | POA: Diagnosis not present

## 2023-03-04 DIAGNOSIS — I6523 Occlusion and stenosis of bilateral carotid arteries: Secondary | ICD-10-CM

## 2023-03-04 DIAGNOSIS — I6350 Cerebral infarction due to unspecified occlusion or stenosis of unspecified cerebral artery: Secondary | ICD-10-CM

## 2023-03-04 DIAGNOSIS — E782 Mixed hyperlipidemia: Secondary | ICD-10-CM

## 2023-03-04 DIAGNOSIS — I2510 Atherosclerotic heart disease of native coronary artery without angina pectoris: Secondary | ICD-10-CM | POA: Diagnosis not present

## 2023-03-04 DIAGNOSIS — I10 Essential (primary) hypertension: Secondary | ICD-10-CM

## 2023-03-04 NOTE — Progress Notes (Signed)
Cardiology Office Note:  .   Date:  03/04/2023  ID:  Jennifer Oconnor, DOB 02-03-1961, MRN [id number] PCP: Jaclyn Shaggy, MD  [facility] Providers Cardiologist:  Debbe Odea, MD    History of Present Illness: .   Jennifer Oconnor is a 62 y.o. female with a past medical history of hyperlipidemia, hypertension, CVA (2019), carotid stenosis status post left TCAR (2019), former smoker x 30+ years, who presents today for follow-up.  Previous echocardiogram completed in 2019 revealed an LVEF of 60 to 65%, no RWMA, and no significant valvular abnormalities.  Repeat echocardiogram revealed an LVEF of 60 to 65%, no RWMA, G1 DD, and mild to moderate TR, mild to moderate MR. Coronary CTA was ordered and pending.  She was last seen in clinic 01/24/2023 by Dr.Agbor-Etang with complaints of chest pain associated with stress or exertion over the last 2 to 3 months.  Symptoms seem to have worsened in severity.  She describes discomfort as a pressure-like located in the central portion of her chest.  She denied any other associated symptoms.  She was scheduled for repeat echocardiogram and a coronary CTA.  She returns to clinic today stating that her previous symptoms of chest pain and occasional shortness of breath has actually improved since she has been under testing.  She also denies any palpitations, peripheral edema, or claudication symptoms.  She has several questions today related to her testing as she has short-term memory issues since her stroke.  She has been compliant with her medications but has some questions her current medication regimen.  She also denies any hospitalizations or visits to the emergency department.  ROS: 10 point review of systems has been reviewed and considered negative with exception of what is been listed in the HPI  Studies Reviewed: Marland Kitchen   EKG Interpretation Date/Time:  Monday March 04 2023 11:33:48 EDT Ventricular Rate:  74 PR Interval:  146 QRS  Duration:  82 QT Interval:  364 QTC Calculation: 404 R Axis:   61  Text Interpretation: Normal sinus rhythm Low voltage QRS When compared with ECG of 24-Jan-2023 10:44, No significant change was found Confirmed by Charlsie Quest ([id number]) on 03/04/2023 11:37:05 AM   TTE 02/20/23 1. Left ventricular ejection fraction, by estimation, is 60 to 65%. The  left ventricle has normal function. The left ventricle has no regional  wall motion abnormalities. Left ventricular diastolic parameters are  consistent with Grade I diastolic  dysfunction (impaired relaxation). The average left ventricular global  longitudinal strain is -22.0 %.   2. Right ventricular systolic function is normal. The right ventricular  size is normal. There is normal pulmonary artery systolic pressure. The  estimated right ventricular systolic pressure is 26.3 mmHg.   3. The mitral valve is normal in structure. Mild to moderate mitral valve  regurgitation. No evidence of mitral stenosis.   4. Tricuspid valve regurgitation is mild to moderate.   5. The aortic valve is tricuspid. Aortic valve regurgitation is not  visualized. No aortic stenosis is present.   6. The inferior vena cava is normal in size with greater than 50%  respiratory variability, suggesting right atrial pressure of 3 mmHg.    TTE 06/12/2017 Left ventricle: The cavity size was normal. Wall thickness was    normal. Systolic function was normal. The estimated ejection    fraction was in the range of 60% to 65%. Wall motion was normal;    there were no regional wall motion abnormalities. Left  ventricular diastolic function parameters were normal for the    patient&'s age.  - Right ventricle: The cavity size was normal. Wall thickness was    normal. Systolic function was normal.  - No significant valvular abnormality.  Risk Assessment/Calculations:             Physical Exam:   VS:  BP 122/78 (BP Location: Left Arm, Patient Position: Sitting, Cuff Size:  Normal)   Pulse 74   Ht 5' (1.524 m)   Wt 134 lb 9.6 oz (61.1 kg)   SpO2 98%   BMI 26.29 kg/m    Wt Readings from Last 3 Encounters:  03/04/23 134 lb 9.6 oz (61.1 kg)  01/24/23 141 lb (64 kg)  01/14/23 142 lb 9.6 oz (64.7 kg)    GEN: Well nourished, well developed in no acute distress NECK: No JVD; No carotid bruits CARDIAC: RRR, no murmurs, rubs, gallops RESPIRATORY:  Clear to auscultation without rales, wheezing or rhonchi  ABDOMEN: Soft, non-tender, non-distended EXTREMITIES:  No edema; No deformity   ASSESSMENT AND PLAN: .   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  EKG today reveals sinus rhythm with a rate of 74 with no ischemic changes noted.  Recent coronary CTA revealed a coronary calcium score of 275 which is the 93rd percentile for age and sex matched control.  Moderate proximal LAD stenosis (50 to 69%).  CAD-RADS 3.  Moderate stenosis with CT FFR submitted revealing no significant stenosis.  She is continued on aspirin and atorvastatin.  With the resolution of the majority of her symptoms and improvement with decrease in anxiety no further medications or further testing were needed today.  Hypertension with blood pressure today 122/78.  Blood pressure remained stable.  She is continued on lisinopril 5 mg daily.  She is also encouraged to continue to monitor pressure 1 to 2 hours postmedication administration at home.  Hyperlipidemia with last LDL of 61.  She is continued on atorvastatin 80 mg daily.  This continues to be monitored by her PCP.  Carotid artery stenosis status post left CEA in 2019.  She is continued on aspirin and atorvastatin.  This continues to be managed by [facility].  History of CVA in 2019 with residual deficits and impaired short-term memory.       Dispo: Patient to return to clinic to see MD/APP in 3 months or sooner if needed for reevaluation of symptoms.  Signed, Jakeisha Stricker, NP

## 2023-03-04 NOTE — Patient Instructions (Signed)
Medication Instructions:  Your physician recommends that you continue on your current medications as directed. Please refer to the Current Medication list given to you today.  *If you need a refill on your cardiac medications before your next appointment, please call your pharmacy*  Lab Work: -None ordered  Testing/Procedures: -None ordered  Follow-Up: At [facility], you and your health needs are our priority.  As part of our continuing mission to provide you with exceptional heart care, we have created designated Provider Care Teams.  These Care Teams include your primary Cardiologist (physician) and Advanced Practice Providers (APPs -  Physician Assistants and Nurse Practitioners) who all work together to provide you with the care you need, when you need it.  Your next appointment:   3 month(s)  Provider:   Debbe Odea, MD or Charlsie Quest, NP    Other Instructions -None

## 2023-03-06 ENCOUNTER — Other Ambulatory Visit: Payer: Self-pay

## 2023-03-06 MED ORDER — ISOSORBIDE MONONITRATE ER 30 MG PO TB24: 15.0000 mg | ORAL_TABLET | Freq: Every day | ORAL | 0 refills | Status: DC

## 2023-05-22 ENCOUNTER — Other Ambulatory Visit: Payer: Self-pay | Admitting: *Deleted

## 2023-05-22 MED ORDER — ISOSORBIDE MONONITRATE ER 30 MG PO TB24: 15.0000 mg | ORAL_TABLET | Freq: Every day | ORAL | 0 refills | Status: DC

## 2023-06-04 ENCOUNTER — Ambulatory Visit: Payer: Medicare HMO | Attending: Cardiology | Admitting: Cardiology

## 2023-06-04 ENCOUNTER — Encounter: Payer: Self-pay | Admitting: Cardiology

## 2023-06-04 VITALS — BP 134/62 | HR 87 | Ht 60.0 in | Wt 140.8 lb

## 2023-06-04 DIAGNOSIS — E782 Mixed hyperlipidemia: Secondary | ICD-10-CM | POA: Diagnosis not present

## 2023-06-04 DIAGNOSIS — I10 Essential (primary) hypertension: Secondary | ICD-10-CM

## 2023-06-04 DIAGNOSIS — I25118 Atherosclerotic heart disease of native coronary artery with other forms of angina pectoris: Secondary | ICD-10-CM

## 2023-06-04 NOTE — Patient Instructions (Signed)
 Medication Instructions:  Your Physician recommend you continue on your current medication as directed.    *If you need a refill on your cardiac medications before your next appointment, please call your pharmacy*   Lab Work: None ordered at this time  If you have labs (blood work) drawn today and your tests are completely normal, you will receive your results only by: MyChart Message (if you have MyChart) OR A paper copy in the mail If you have any lab test that is abnormal or we need to change your treatment, we will call you to review the results.   Testing/Procedures: None ordered at this time    Follow-Up: At [facility], you and your health needs are our priority.  As part of our continuing mission to provide you with exceptional heart care, we have created designated Provider Care Teams.  These Care Teams include your primary Cardiologist (physician) and Advanced Practice Providers (APPs -  Physician Assistants and Nurse Practitioners) who all work together to provide you with the care you need, when you need it.  We recommend signing up for the patient portal called "MyChart".  Sign up information is provided on this After Visit Summary.  MyChart is used to connect with patients for Virtual Visits (Telemedicine).  Patients are able to view lab/test results, encounter notes, upcoming appointments, etc.  Non-urgent messages can be sent to your provider as well.   To learn more about what you can do with MyChart, go to [email or web address].    Your next appointment:   1 year(s)  Provider:   You may see Debbe Odea, MD or one of the following Advanced Practice Providers on your designated Care Team:   Nicolasa Ducking, NP Eula Listen, PA-C Cadence Fransico Michael, PA-C Charlsie Quest, NP Carlos Levering, NP

## 2023-06-04 NOTE — Progress Notes (Signed)
 Cardiology Office Note:    Date:  06/04/2023   ID:  Jennifer, Oconnor [birth year], MRN [id number]  PCP:  Corlis Honor BROCKS, MD   [facility] Providers Cardiologist:  Redell Cave, MD     Referring MD: Corlis Honor BROCKS, MD   Chief Complaint  Patient presents with   Follow-up    Patient denies new or acute cardiac problems/concerns today.      History of Present Illness:    Jennifer Oconnor is a 62 y.o. female with a hx of nonobstructive CAD (moderate proximal LAD stenosis on CCTA 9/24 ), hyperlipidemia, hypertension, CVA 2019, carotid stenosis s/p left CEA 2019, former smoker x 30+ years, who presents for follow-up.  Previously seen with symptoms of chest pain, echocardiogram and coronary CTA obtained to evaluate cardiac etiology.  CT showed nonobstructive CAD with moderate stenosis in proximal LAD.  Echo showed normal EF 60 to 65%.  Imdur  50 mg daily was started with good effect.  She currently denies chest pain, feels well, has no concerns at this time.  Also takes Norvasc 2.5 mg daily for BP control.  Prior notes/studies Echo 9/24 EF 60 to 65%, mild MR. Coronary CT 02/2023 moderate proximal LAD stenosis, calcium  score 275.  CT FFR not significant. 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   Mild    Past Surgical History:  Procedure Laterality Date   ABDOMINAL HYSTERECTOMY  1993   COLON SURGERY     colectomy 9/11   COLONOSCOPY N/A 10/15/2014   Procedure: COLONOSCOPY;  Surgeon: Rogelia Copping, MD;  Location: [facility];  Service: Gastroenterology;  Laterality: N/A;  cecum time- 1003   COLONOSCOPY WITH PROPOFOL  N/A 04/14/2021   Procedure:  COLONOSCOPY WITH PROPOFOL ;  Surgeon: Copping Rogelia, MD;  Location: [facility];  Service: Endoscopy;  Laterality: N/A;   ENDARTERECTOMY Left 06/14/2017   Procedure: ENDARTERECTOMY CAROTID;  Surgeon: Jama Cordella MATSU, MD;  Location: [facility];  Service: Vascular;  Laterality: Left;   PILONIDAL CYST DRAINAGE  02/13/12   POLYPECTOMY  10/15/2014   Procedure: POLYPECTOMY INTESTINAL;  Surgeon: Rogelia Copping, MD;  Location: [facility];  Service: Gastroenterology;;    Current Medications: Current Meds  Medication Sig   acetaminophen  (TYLENOL ) 325 MG tablet Take 1-2 tablets (325-650 mg total) by mouth every 4 (four) hours as needed for mild pain (or temp >/= 101 F).   ALPRAZolam  (XANAX ) 0.5 MG tablet Take 0.5 mg by mouth 2 (two) times daily as needed for anxiety.   amLODipine (NORVASC) 2.5 MG tablet Take 2.5 mg by mouth daily.   aspirin  EC 81 MG EC tablet Take 1 tablet (81 mg total) by mouth daily.   atorvastatin  (LIPITOR) 80 MG tablet Take 1 tablet (80 mg total) by mouth daily at 6 PM.   buPROPion  (WELLBUTRIN  XL) 150 MG 24 hr tablet    estradiol (VIVELLE-DOT) 0.025 MG/24HR Place 1 patch onto the skin 2 (two) times a week. Wed/Sun   isosorbide  mononitrate (IMDUR ) 30 MG 24 hr tablet Take 0.5 tablets (15 mg total) by mouth daily.   latanoprost (XALATAN) 0.005 % ophthalmic solution 1 drop at bedtime.  nitroGLYCERIN  (NITROSTAT ) 0.4 MG SL tablet Place 0.4 mg under the tongue every 5 (five) minutes as needed.   sertraline (ZOLOFT) 25 MG tablet Take 25 mg (1 tablet) every morning for one week then increase to 50 mg (two tablets) every morning.     Allergies:   Lisinopril   Social History   Socioeconomic History   Marital status: Married    Spouse name: Not on file   Number of children: Not on file   Years of education: Not on file   Highest education level: Not on file  Occupational History   Not on file  Tobacco Use   Smoking status: Former    Current packs/day: 0.00    Average  packs/day: 0.5 packs/day for 40.0 years (20.0 ttl pk-yrs)    Types: Cigarettes    Start date: 06/14/1977    Quit date: 06/14/2017    Years since quitting: 5.9   Smokeless tobacco: Never  Vaping Use   Vaping status: Never Used  Substance and Sexual Activity   Alcohol use: No   Drug use: No   Sexual activity: Yes    Birth control/protection: Surgical  Other Topics Concern   Not on file  Social History Narrative   Lives with husband   Has service dog that recognizes stroke symptoms.   Social Drivers of Corporate Investment Banker Strain: Low Risk  (06/11/2017)   Overall Financial Resource Strain (CARDIA)    Difficulty of Paying Living Expenses: Not hard at all  Food Insecurity: No Food Insecurity (06/11/2017)   Hunger Vital Sign    Worried About Running Out of Food in the Last Year: Never true    Ran Out of Food in the Last Year: Never true  Transportation Needs: No Transportation Needs (06/11/2017)   PRAPARE - Administrator, Civil Service (Medical): No    Lack of Transportation (Non-Medical): No  Physical Activity: Insufficiently Active (06/11/2017)   Exercise Vital Sign    Days of Exercise per Week: 2 days    Minutes of Exercise per Session: 10 min  Stress: No Stress Concern Present (06/11/2017)   [employer] of Occupational Health - Occupational Stress Questionnaire    Feeling of Stress : Only a little  Social Connections: Socially Integrated (06/11/2017)   Social Connection and Isolation Panel [NHANES]    Frequency of Communication with Friends and Family: More than three times a week    Frequency of Social Gatherings with Friends and Family: More than three times a week    Attends Religious Services: More than 4 times per year    Active Member of [employer] or Organizations: Yes    Attends Engineer, Structural: More than 4 times per year    Marital Status: Married     Family History: The patient's family history includes Alzheimer's disease in her father;  Heart attack in her mother.  ROS:   Please see the history of present illness.     All other systems reviewed and are negative.  EKGs/Labs/Other Studies Reviewed:    The following studies were reviewed today:       Recent Labs: 01/24/2023: BUN 13; Creatinine, Ser 0.83; Potassium 4.7; Sodium 143  Recent Lipid Panel    Component Value Date/Time   CHOL 247 (H) 06/12/2017 0604   TRIG 755 (H) 06/12/2017 0604   HDL 32 (L) 06/12/2017 0604   CHOLHDL 7.7 06/12/2017 0604   VLDL UNABLE TO CALCULATE IF TRIGLYCERIDE OVER 400 mg/dL 98/90/7980 9395  LDLCALC UNABLE TO CALCULATE IF TRIGLYCERIDE OVER 400 mg/dL 98/90/7980 9395     Risk Assessment/Calculations:             Physical Exam:    VS:  BP 134/62 (BP Location: Left Arm, Patient Position: Sitting, Cuff Size: Normal)   Pulse 87   Ht 5' (1.524 m)   Wt 140 lb 12.8 oz (63.9 kg)   SpO2 98%   BMI 27.50 kg/m     Wt Readings from Last 3 Encounters:  06/04/23 140 lb 12.8 oz (63.9 kg)  03/04/23 134 lb 9.6 oz (61.1 kg)  01/24/23 141 lb (64 kg)     GEN:  Well nourished, well developed in no acute distress HEENT: Normal NECK: No JVD; No carotid bruits CARDIAC: RRR, no murmurs, rubs, gallops RESPIRATORY:  Clear to auscultation without rales, wheezing or rhonchi  ABDOMEN: Soft, non-tender, non-distended MUSCULOSKELETAL:  No edema; No deformity  SKIN: Warm and dry NEUROLOGIC:  Alert and oriented x 3 PSYCHIATRIC:  Normal affect   ASSESSMENT:    1. Coronary artery disease of native artery of native heart with stable angina pectoris (HCC)   2. Mixed hyperlipidemia   3. Primary hypertension     PLAN:    In order of problems listed above:  Nonobstructive CAD, coronary CT with moderate proximal LAD stenosis, calcium  score is 275, FFR CT with no significant stenosis.  Chest pain controlled with Imdur  15 mg daily.  Continue aspirin  81 mg, Lipitor 80 mg daily, Imdur . Hyperlipidemia, continue Lipitor 80 mg daily, aspirin  81.  Plans to  recheck lipid panel next month with PCP. Hypertension, BP controlled, continue Norvasc 2.5 mg daily.  Follow-up yearly.     Medication Adjustments/Labs and Tests Ordered: Current medicines are reviewed at length with the patient today.  Concerns regarding medicines are outlined above.  No orders of the defined types were placed in this encounter.  No orders of the defined types were placed in this encounter.   Patient Instructions  Medication Instructions:  Your Physician recommend you continue on your current medication as directed.    *If you need a refill on your cardiac medications before your next appointment, please call your pharmacy*   Lab Work: None ordered at this time  If you have labs (blood work) drawn today and your tests are completely normal, you will receive your results only by: MyChart Message (if you have MyChart) OR A paper copy in the mail If you have any lab test that is abnormal or we need to change your treatment, we will call you to review the results.   Testing/Procedures: None ordered at this time    Follow-Up: At [facility], you and your health needs are our priority.  As part of our continuing mission to provide you with exceptional heart care, we have created designated Provider Care Teams.  These Care Teams include your primary Cardiologist (physician) and Advanced Practice Providers (APPs -  Physician Assistants and Nurse Practitioners) who all work together to provide you with the care you need, when you need it.  We recommend signing up for the patient portal called MyChart.  Sign up information is provided on this After Visit Summary.  MyChart is used to connect with patients for Virtual Visits (Telemedicine).  Patients are able to view lab/test results, encounter notes, upcoming appointments, etc.  Non-urgent messages can be sent to your provider as well.   To learn more about what you can do with MyChart, go to  [email or web address].  Your next appointment:   1 year(s)  Provider:   You may see Redell Cave, MD or one of the following Advanced Practice Providers on your designated Care Team:   Lonni Meager, NP Bernardino Bring, PA-C Cadence Franchester, PA-C Tylene Lunch, NP Barnie Hila, NP    Signed, Redell Cave, MD  06/04/2023 3:14 PM    [facility]

## 2023-08-12 ENCOUNTER — Ambulatory Visit (INDEPENDENT_AMBULATORY_CARE_PROVIDER_SITE_OTHER): Admitting: Dermatology

## 2023-08-12 ENCOUNTER — Encounter: Payer: Self-pay | Admitting: Dermatology

## 2023-08-12 DIAGNOSIS — L814 Other melanin hyperpigmentation: Secondary | ICD-10-CM

## 2023-08-12 DIAGNOSIS — W908XXA Exposure to other nonionizing radiation, initial encounter: Secondary | ICD-10-CM

## 2023-08-12 DIAGNOSIS — Z1283 Encounter for screening for malignant neoplasm of skin: Secondary | ICD-10-CM

## 2023-08-12 DIAGNOSIS — L820 Inflamed seborrheic keratosis: Secondary | ICD-10-CM | POA: Diagnosis not present

## 2023-08-12 DIAGNOSIS — L578 Other skin changes due to chronic exposure to nonionizing radiation: Secondary | ICD-10-CM

## 2023-08-12 DIAGNOSIS — D1801 Hemangioma of skin and subcutaneous tissue: Secondary | ICD-10-CM

## 2023-08-12 DIAGNOSIS — Z85828 Personal history of other malignant neoplasm of skin: Secondary | ICD-10-CM

## 2023-08-12 DIAGNOSIS — L729 Follicular cyst of the skin and subcutaneous tissue, unspecified: Secondary | ICD-10-CM

## 2023-08-12 DIAGNOSIS — Z7189 Other specified counseling: Secondary | ICD-10-CM

## 2023-08-12 DIAGNOSIS — L821 Other seborrheic keratosis: Secondary | ICD-10-CM

## 2023-08-12 DIAGNOSIS — Z5111 Encounter for antineoplastic chemotherapy: Secondary | ICD-10-CM

## 2023-08-12 DIAGNOSIS — Z79899 Other long term (current) drug therapy: Secondary | ICD-10-CM

## 2023-08-12 DIAGNOSIS — L570 Actinic keratosis: Secondary | ICD-10-CM | POA: Diagnosis not present

## 2023-08-12 DIAGNOSIS — D229 Melanocytic nevi, unspecified: Secondary | ICD-10-CM

## 2023-08-12 DIAGNOSIS — L720 Epidermal cyst: Secondary | ICD-10-CM

## 2023-08-12 MED ORDER — FLUOROURACIL 5 % EX CREA: TOPICAL_CREAM | Freq: Two times a day (BID) | CUTANEOUS | 0 refills | Status: AC

## 2023-08-12 NOTE — Patient Instructions (Addendum)
Instructions for [facility] Medications  One or more of your medications was sent to the [facility] mail order compounding pharmacy. You will receive an email from them and can purchase the medicine through that link. It will then be mailed to your home at the address you confirmed. If for any reason you do not receive an email from them, please check your spam folder. If you still do not find the email, please let us know. [facility] phone number is [phone].  5-Fluorouracil/Calcipotriene Patient Education   Actinic keratoses are the dry, red scaly spots on the skin caused by sun damage. A portion of these spots can turn into skin cancer with time, and treating them can help prevent development of skin cancer.   Treatment of these spots requires removal of the defective skin cells. There are various ways to remove actinic keratoses, including freezing with liquid nitrogen, treatment with creams, or treatment with a blue light procedure in the office.   5-fluorouracil cream is a topical cream used to treat actinic keratoses. It works by interfering with the growth of abnormal fast-growing skin cells, such as actinic keratoses. These cells peel off and are replaced by healthy ones. THIS CREAM SHOULD BE KEPT OUT OF REACH OF CHILDREN AND PETS AND SHOULD NOT BE USED BY PREGNANT WOMEN.  5-fluorouracil/calcipotriene is a combination of the 5-fluorouracil cream with a vitamin D analog cream called calcipotriene. The calcipotriene alone does not treat actinic keratoses. However, when it is combined with 5-fluorouracil, it helps the 5-fluorouracil treat the actinic keratoses much faster so that the same results can be achieved with a much shorter treatment time.  INSTRUCTIONS FOR 5-FLUOROURACIL/CALCIPOTRIENE CREAM:   5-fluorouracil/calcipotriene cream typically only needs to be used for 4-7 days. A thin layer should be applied twice a day to the treatment areas recommended by your  physician.   If your physician prescribed you separate tubes of 5-fluourouracil and calcipotriene, apply a thin layer of 5-fluorouracil followed by a thin layer of calcipotriene.   Avoid contact with your eyes or nostrils. Avoid applying the cream to your eyelids or lips unless directed to apply there by your physician. Do not use 5-fluorouracil/calcipotriene cream on infected or open wounds.   You will develop redness, irritation and some crusting at areas where you have pre-cancer damage/actinic keratoses. IF YOU DEVELOP PAIN, BLEEDING, OR SIGNIFICANT CRUSTING, STOP THE TREATMENT EARLY - you have already gotten a good response and the actinic keratoses should clear up well.  Wash your hands after applying 5-fluorouracil 5% cream on your skin.   A moisturizer or sunscreen with a minimum SPF 30 should be applied each morning.   Once you have finished the treatment, you can apply a thin layer of Vaseline twice a day to irritated areas to soothe and calm the areas more quickly. If you experience significant discomfort, contact your physician.  For some patients it is necessary to repeat the treatment for best results.  SIDE EFFECTS: When using 5-fluorouracil/calcipotriene cream, you may have mild irritation, such as redness, dryness, swelling, or a mild burning sensation. This usually resolves within 2 weeks. The more actinic keratoses you have, the more redness and inflammation you can expect during treatment. Eye irritation has been reported rarely. If this occurs, please let us know.   If you have any trouble using this cream, please send [place] a MyChart message or call the office. If you have any other questions about this information, please do not hesitate to ask me before  you leave the office or contact me on MyChart or by phone.    Due to recent changes in healthcare laws, you may see results of your pathology and/or laboratory studies on MyChart before the doctors have had a chance to  review them. We understand that in some cases there may be results that are confusing or concerning to you. Please understand that not all results are received at the same time and often the doctors may need to interpret multiple results in order to provide you with the best plan of care or course of treatment. Therefore, we ask that you please give [place] 2 business days to thoroughly review all your results before contacting the office for clarification. Should we see a critical lab result, you will be contacted sooner.   If You Need Anything After Your Visit  If you have any questions or concerns for your doctor, please call our main line at [phone] and press option 4 to reach your doctor's medical assistant. If no one answers, please leave a voicemail as directed and we will return your call as soon as possible. Messages left after 4 pm will be answered the following business day.   You may also send [place] a message via MyChart. We typically respond to MyChart messages within 1-2 business days.  For prescription refills, please ask your pharmacy to contact our office. Our fax number is [phone].  If you have an urgent issue when the clinic is closed that cannot wait until the next business day, you can page your doctor at the number below.    Please note that while we do our best to be available for urgent issues outside of office hours, we are not available 24/7.   If you have an urgent issue and are unable to reach [place], you may choose to seek medical care at your doctor's office, retail clinic, urgent care center, or emergency room.  If you have a medical emergency, please immediately call 911 or go to the emergency department.  Pager Numbers  - Dr. Gwen Pounds: [phone]  - Dr. Roseanne Reno: [phone]  - Dr. Katrinka Blazing: [phone]   In the event of inclement weather, please call our main line at [phone] for an update on the status of any delays or closures.  Dermatology  Medication Tips: Please keep the boxes that topical medications come in in order to help keep track of the instructions about where and how to use these. Pharmacies typically print the medication instructions only on the boxes and not directly on the medication tubes.   If your medication is too expensive, please contact our office at [phone] option 4 or send [place] a message through MyChart.   We are unable to tell what your co-pay for medications will be in advance as this is different depending on your insurance coverage. However, we may be able to find a substitute medication at lower cost or fill out paperwork to get insurance to cover a needed medication.   If a prior authorization is required to get your medication covered by your insurance company, please allow [place] 1-2 business days to complete this process.  Drug prices often vary depending on where the prescription is filled and some pharmacies may offer cheaper prices.  The website www.goodrx.com contains coupons for medications through different pharmacies. The prices here do not account for what the cost may be with help from insurance (it may be cheaper with your insurance), but the website can give you the price if you  did not use any insurance.  - You can print the associated coupon and take it with your prescription to the pharmacy.  - You may also stop by our office during regular business hours and pick up a GoodRx coupon card.  - If you need your prescription sent electronically to a different pharmacy, notify our office through [facility] or by phone at [phone] option 4.     Si Usted Necesita Algo Despus de Su Visita  Tambin puede enviarnos un mensaje a travs de Clinical cytogeneticist. Por lo general respondemos a los mensajes de MyChart en el transcurso de 1 a 2 das hbiles.  Para renovar recetas, por favor pida a su farmacia que se ponga en contacto con nuestra oficina. Annie Sable de fax es [place] [phone].  Si  tiene un asunto urgente cuando la clnica est cerrada y que no puede esperar hasta el siguiente da hbil, puede llamar/localizar a su doctor(a) al nmero que aparece a continuacin.   Por favor, tenga en cuenta que aunque hacemos todo lo posible para estar disponibles para asuntos urgentes fuera del horario de [place], no estamos disponibles las 24 horas del da, los 7 [address]  [address] de la [place].   Si tiene un problema urgente y no puede comunicarse con nosotros, puede optar por buscar atencin mdica  en el consultorio de su doctor(a), en una clnica privada, en un centro de atencin urgente o en una sala de emergencias.  Si tiene Engineer, drilling, por favor llame inmediatamente al 911 o vaya a la sala de emergencias.  Nmeros de bper  - Dr. Gwen Pounds: [phone]  - Dra. Roseanne Reno: [phone]  - Dr. Katrinka Blazing: [phone]   En caso de inclemencias del tiempo, por favor llame a Lacy Duverney principal al [phone] para una actualizacin sobre el [place] de cualquier retraso o cierre.  Consejos para la medicacin en dermatologa: Por favor, guarde las cajas en las que vienen los medicamentos de uso tpico para ayudarle a seguir las instrucciones sobre dnde y cmo usarlos. Las farmacias generalmente imprimen las instrucciones del medicamento slo en las cajas y no directamente en los tubos del [place].   Si su medicamento es muy caro, por favor, pngase en contacto con Rolm Gala llamando al [phone] y presione la opcin 4 o envenos un mensaje a travs de Clinical cytogeneticist.   No podemos decirle cul ser su copago por los medicamentos por adelantado ya que esto es diferente dependiendo de la cobertura de su seguro. Sin embargo, es posible que podamos encontrar un medicamento sustituto a Audiological scientist un formulario para que el seguro cubra el medicamento que se considera necesario.   Si se requiere una autorizacin previa para que su compaa de seguros [place] su medicamento, por  favor permtanos de 1 a 2 das hbiles para completar [address]  Los precios de los medicamentos varan con frecuencia dependiendo del Environmental consultant de dnde se surte la receta y alguna farmacias pueden ofrecer precios ms baratos.  El sitio web www.goodrx.com tiene cupones para medicamentos de Health and safety inspector. Los precios aqu no tienen en cuenta lo que podra costar con la ayuda del seguro (puede ser ms barato con su seguro), pero el sitio web puede darle el precio si no utiliz Tourist information centre manager.  - Puede imprimir el cupn correspondiente y llevarlo con su receta a la farmacia.  - Tambin puede pasar por nuestra oficina durante el horario de atencin regular y Education officer, museum una tarjeta de cupones de GoodRx.  - Si necesita que su receta  se enve electrnicamente a una farmacia diferente, informe a nuestra oficina a travs de MyChart de [place] o por telfono llamando al [phone] y presione la opcin 4.

## 2023-08-12 NOTE — Progress Notes (Signed)
 Follow-Up Visit   Subjective  Jennifer Oconnor is a 63 y.o. female who presents for the following: Skin Cancer Screening and Full Body Skin Exam The patient presents for Total-Body Skin Exam (TBSE) for skin cancer screening and mole check. The patient has spots, moles and lesions to be evaluated, some may be new or changing and the patient may have concern these could be cancer.  The following portions of the chart were reviewed this encounter and updated as appropriate: medications, allergies, medical history  Review of Systems:  No other skin or systemic complaints except as noted in HPI or Assessment and Plan.  Objective  Well appearing patient in no apparent distress; mood and affect are within normal limits.  A full examination was performed including scalp, head, eyes, ears, nose, lips, neck, chest, axillae, abdomen, back, buttocks, bilateral upper extremities, bilateral lower extremities, hands, feet, fingers, toes, fingernails, and toenails. All findings within normal limits unless otherwise noted below.   Relevant physical exam findings are noted in the Assessment and Plan.  L cheek x 1 Erythematous thin papules/macules with gritty scale.  L med little toe x 1 Erythematous stuck-on, waxy papule or plaque   Assessment & Plan   SKIN CANCER SCREENING PERFORMED TODAY.  ACTINIC DAMAGE WITH PRECANCEROUS ACTINIC KERATOSES Counseling for Topical Chemotherapy Management: Patient exhibits: - Severe, confluent actinic changes with pre-cancerous actinic keratoses that is secondary to cumulative UV radiation exposure over time - Condition that is severe; chronic, not at goal. - diffuse scaly erythematous macules and papules with underlying dyspigmentation - Discussed Prescription "Field Treatment" topical Chemotherapy for Severe, Chronic Confluent Actinic Changes with Pre-Cancerous Actinic Keratoses Field treatment involves treatment of an entire area of skin that has confluent  Actinic Changes (Sun/ Ultraviolet light damage) and PreCancerous Actinic Keratoses by method of PhotoDynamic Therapy (PDT) and/or prescription Topical Chemotherapy agents such as 5-fluorouracil, 5-fluorouracil/calcipotriene, and/or imiquimod.  The purpose is to decrease the number of clinically evident and subclinical PreCancerous lesions to prevent progression to development of skin cancer by chemically destroying early precancer changes that may or may not be visible.  It has been shown to reduce the risk of developing skin cancer in the treated area. As a result of treatment, redness, scaling, crusting, and open sores may occur during treatment course. One or more than one of these methods may be used and may have to be used several times to control, suppress and eliminate the PreCancerous changes. Discussed treatment course, expected reaction, and possible side effects. - Recommend daily broad spectrum sunscreen SPF 30+ to sun-exposed areas, reapply every 2 hours as needed.  - Staying in the shade or wearing long sleeves, sun glasses (UVA+UVB protection) and wide brim hats (4-inch brim around the entire circumference of the hat) are also recommended. - Call for new or changing lesions. - Start 5FU/Calcipotriene mix BID x 7 days on the forehead and temples.   LENTIGINES, SEBORRHEIC KERATOSES, HEMANGIOMAS - Benign normal skin lesions - Benign-appearing - Call for any changes  MELANOCYTIC NEVI - Tan-brown and/or pink-flesh-colored symmetric macules and papules - Benign appearing on exam today - Observation - Call clinic for new or changing moles - Recommend daily use of broad spectrum spf 30+ sunscreen to sun-exposed areas.   HISTORY OF BASAL CELL CARCINOMA OF THE SKIN - No evidence of recurrence today - Recommend regular full body skin exams - Recommend daily broad spectrum sunscreen SPF 30+ to sun-exposed areas, reapply every 2 hours as needed.  - Call if any  new or changing lesions are noted  between office visits  [place] - tiny firm white papules - type of cyst - benign - sometimes these will clear with nightly OTC adapalene/Differin 0.1% gel or retinol. - may be extracted if symptomatic - observe  AK (ACTINIC KERATOSIS) L cheek x 1 Actinic keratoses are precancerous spots that appear secondary to cumulative UV radiation exposure/sun exposure over time. They are chronic with expected duration over 1 year. A portion of actinic keratoses will progress to squamous cell carcinoma of the skin. It is not possible to reliably predict which spots will progress to skin cancer and so treatment is recommended to prevent development of skin cancer.  Recommend daily broad spectrum sunscreen SPF 30+ to sun-exposed areas, reapply every 2 hours as needed.  Recommend staying in the shade or wearing long sleeves, sun glasses (UVA+UVB protection) and wide brim hats (4-inch brim around the entire circumference of the hat). Call for new or changing lesions.  Destruction of lesion - L cheek x 1 Complexity: simple   Destruction method: cryotherapy   Informed consent: discussed and consent obtained   Timeout:  patient name, date of birth, surgical site, and procedure verified Lesion destroyed using liquid nitrogen: Yes   Region frozen until ice ball extended beyond lesion: Yes   Outcome: patient tolerated procedure well with no complications   Post-procedure details: wound care instructions given   INFLAMED SEBORRHEIC KERATOSIS L med little toe x 1 Symptomatic, irritating, patient would like treated.  Destruction of lesion - L med little toe x 1 Complexity: simple   Destruction method: cryotherapy   Informed consent: discussed and consent obtained   Timeout:  patient name, date of birth, surgical site, and procedure verified Lesion destroyed using liquid nitrogen: Yes   Region frozen until ice ball extended beyond lesion: Yes   Outcome: patient tolerated procedure well with no complications    Post-procedure details: wound care instructions given    Return in about 1 year (around 08/11/2024) for TBSE.  Maylene Roes, CMA, am acting as scribe for Armida Sans, MD .  Documentation: I have reviewed the above documentation for accuracy and completeness, and I agree with the above.  Armida Sans, MD

## 2023-08-14 ENCOUNTER — Ambulatory Visit: Payer: Medicare HMO | Admitting: Dermatology

## 2023-08-16 ENCOUNTER — Other Ambulatory Visit: Payer: Self-pay

## 2023-08-16 MED ORDER — ISOSORBIDE MONONITRATE ER 30 MG PO TB24: 15.0000 mg | ORAL_TABLET | Freq: Every day | ORAL | 2 refills | Status: DC

## 2023-10-22 ENCOUNTER — Encounter (INDEPENDENT_AMBULATORY_CARE_PROVIDER_SITE_OTHER): Payer: Self-pay

## 2024-01-02 ENCOUNTER — Ambulatory Visit
Admission: RE | Admit: 2024-01-02 | Discharge: 2024-01-02 | Disposition: A | Discharge status: Home / Self Care | Source: Ambulatory Visit | Attending: Urgent Care | Admitting: Urgent Care

## 2024-01-02 ENCOUNTER — Other Ambulatory Visit: Payer: Self-pay | Admitting: Urgent Care

## 2024-01-02 DIAGNOSIS — R221 Localized swelling, mass and lump, neck: Secondary | ICD-10-CM

## 2024-01-02 MED ORDER — IOPAMIDOL (ISOVUE-300) INJECTION 61%
80.0000 mL | Freq: Once | INTRAVENOUS | Status: AC | PRN
  Administered 2024-01-02: 80 mL via INTRAVENOUS

## 2024-01-16 NOTE — Progress Notes (Unsigned)
 MRN : [id number]  Jennifer Oconnor is a 63 y.o. ([birth year]) female who presents with chief complaint of check carotid arteries.  History of Present Illness:   The patient is seen for follow up evaluation of carotid stenosis. She is s/p left CEA on 06/14/2017.  The carotid stenosis followed by ultrasound.   The patient denies amaurosis fugax. There is no recent history of TIA symptoms or focal motor deficits. 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  She continues to complain of a knot at the top of the incision.  CT scan dated 01/02/2024 is reviewed by me with the patient it does confirm that she has what appeared to be several reactive lymph nodes in the area of concern.   The patient is taking enteric-coated aspirin  81 mg daily.   There is no history of migraine headaches. There is no history of seizures.  As an interval issue since her last visit she has been increasingly dizzy and dealing with syncopal episodes.  This has been associated with significant swings in her blood pressure with multiple hypertensive episodes.   The patient has a history of coronary artery disease, no recent episodes of angina or shortness of breath. The patient denies PAD or claudication symptoms. There is a history of hyperlipidemia which is being treated with a statin.     Carotid Duplex done today shows RICA 40-59% and LICA widely patent s/p CEA.  No change compared to last study  No outpatient medications have been marked as taking for the 01/20/24 encounter (Appointment) with Jama, Cordella MATSU, MD.    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   Mild     Past Surgical History:  Procedure Laterality Date   ABDOMINAL HYSTERECTOMY  1993   COLON SURGERY     colectomy 9/11   COLONOSCOPY N/A 10/15/2014   Procedure: COLONOSCOPY;  Surgeon: Rogelia Copping, MD;  Location: [facility];  Service: Gastroenterology;  Laterality: N/A;  cecum time- 1003   COLONOSCOPY WITH PROPOFOL  N/A 04/14/2021   Procedure: COLONOSCOPY WITH PROPOFOL ;  Surgeon: Copping Rogelia, MD;  Location: [facility];  Service: Endoscopy;  Laterality: N/A;   ENDARTERECTOMY Left 06/14/2017   Procedure: ENDARTERECTOMY CAROTID;  Surgeon: Jama Cordella MATSU, MD;  Location: [facility];  Service: Vascular;  Laterality: Left;   PILONIDAL CYST DRAINAGE  02/13/12   POLYPECTOMY  10/15/2014   Procedure: POLYPECTOMY INTESTINAL;  Surgeon: Rogelia Copping, MD;  Location: [facility];  Service: Gastroenterology;;    Social History Social History   Tobacco Use   Smoking status: Former    Current packs/day: 0.00    Average packs/day: 0.5 packs/day for 40.0 years (20.0 ttl pk-yrs)    Types: Cigarettes    Start date: 06/14/1977    Quit date: 06/14/2017    Years since quitting: 6.5   Smokeless tobacco: Never  Vaping Use   Vaping status: Never Used  Substance Use Topics   Alcohol use: No   Drug use: No    Family History  Family History  Problem Relation Age of Onset   Heart attack Mother    Alzheimer's disease Father     Allergies  Allergen Reactions   Lisinopril Cough     REVIEW OF SYSTEMS (Negative unless checked)  Constitutional: [] Weight loss  [] Fever  [] Chills Cardiac: [] Chest pain   [] Chest pressure   [] Palpitations   [] Shortness of breath when laying flat   [] Shortness of breath with exertion. Vascular:  [x] Pain in legs with walking   [] Pain in legs at rest  [] History of DVT   [] Phlebitis   [] Swelling in legs   [] Varicose veins   [] Non-healing ulcers Pulmonary:   [] Uses home oxygen   [] Productive cough   [] Hemoptysis   [] Wheeze  [] COPD   [] Asthma Neurologic:   [] Dizziness   [] Seizures   [] History of stroke   [] History of TIA  [] Aphasia   [] Vissual changes   [] Weakness or numbness in arm   [] Weakness or numbness in leg Musculoskeletal:   [] Joint swelling   [] Joint pain   [] Low back pain Hematologic:  [] Easy bruising  [] Easy bleeding   [] Hypercoagulable state   [] Anemic Gastrointestinal:  [] Diarrhea   [] Vomiting  [] Gastroesophageal reflux/heartburn   [] Difficulty swallowing. Genitourinary:  [] Chronic kidney disease   [] Difficult urination  [] Frequent urination   [] Blood in urine Skin:  [] Rashes   [] Ulcers  Psychological:  [] History of anxiety   []  History of major depression.  Physical Examination  There were no vitals filed for this visit. There is no height or weight on file to calculate BMI. Gen: WD/WN, NAD Head: [place]/AT, No temporalis wasting.  Ear/Nose/Throat: Hearing grossly intact, nares w/o erythema or drainage Eyes: PER, EOMI, sclera nonicteric.  Neck: Supple, no masses.  No bruit or JVD.  Pulmonary:  Good air movement, no audible wheezing, no use of accessory muscles.  Cardiac: RRR, normal S1, S2, no Murmurs. Vascular:  carotid bruit noted incisional scar left neck well-healed soft with minimal scarring.  There is a palpable lymph node in the superior aspect of the incision Vessel Right Left  Radial Palpable Palpable  Carotid  Palpable  Palpable  Subclav  Palpable Palpable  Gastrointestinal: soft, non-distended. No guarding/no peritoneal signs.  Musculoskeletal: M/S 5/5 throughout.  No visible deformity.  Neurologic: CN 2-12 intact. Pain and light touch intact in extremities.  Symmetrical.  Speech is fluent. Motor exam as listed above. Psychiatric: Judgment intact, Mood & affect appropriate for pt's clinical situation. Dermatologic: No rashes or ulcers noted.  No changes consistent with cellulitis.   CBC Lab Results  Component Value Date   WBC 10.2 06/15/2017   HGB 12.7 06/15/2017   HCT 37.8 06/15/2017   MCV 95.2 06/15/2017    PLT 274 06/15/2017    BMET    Component Value Date/Time   NA 143 01/24/2023 1122   K 4.7 01/24/2023 1122   CL 107 (H) 01/24/2023 1122   CO2 25 01/24/2023 1122   GLUCOSE 82 01/24/2023 1122   GLUCOSE 151 (H) 06/15/2017 0402   BUN 13 01/24/2023 1122   CREATININE 0.83 01/24/2023 1122   CALCIUM  9.3 01/24/2023 1122   GFRNONAA >60 06/15/2017 0402   GFRAA >60 06/15/2017 0402   CrCl cannot be calculated (Patient's most recent lab result is older than the maximum 21 days allowed.).  COAG No results found for: INR, PROTIME  Radiology CT SOFT TISSUE NECK W CONTRAST Result Date: 01/02/2024 CLINICAL DATA:  Left-sided ear pain radiating to the neck and jaw. Tender to the touch. EXAM: CT NECK  WITH CONTRAST TECHNIQUE: Multidetector CT imaging of the neck was performed using the standard protocol following the bolus administration of intravenous contrast. RADIATION DOSE REDUCTION: This exam was performed according to the departmental dose-optimization program which includes automated exposure control, adjustment of the mA and/or kV according to patient size and/or use of iterative reconstruction technique. CONTRAST:  80mL ISOVUE -300 IOPAMIDOL  (ISOVUE -300) INJECTION 61% COMPARISON:  06/12/2017 FINDINGS: Pharynx and larynx: No definite mucosal or submucosal mass. Slight prominence of the mucosal tissue at the base of the tongue which is most commonly benign lingual tonsillar tissue. Direct inspection could be considered to exclude an early tongue base mucosal mass. Salivary glands: Parotid and submandibular glands are normal. Thyroid: Normal Lymph nodes: No lymphadenopathy on the right. On the left in the region of clinical concern, there is a slightly heterogeneous borderline enlarged level 2 lymph node measuring 1.6 by 1.2 x 1.0 cm. Immediately adjacent to that, there is a round 6 x 7 mm level 2 level 3 junction node. Just inferior to that there is a nearly round level 3 node measuring 5 x 5 x 6 mm.  Because these are the level of the patient's reported symptoms, they are viewed with some concern. The could be enlarged due to reactive inflammation or could be evidence of early metastatic disease. At the least, close follow-up would be recommended and if symptoms persist or worsen biopsy could be considered. Vascular: Previous endarterectomy at the carotid bifurcation on the left. Extensive calcified plaque at the carotid bifurcation and ICA bulb on the right. This appears to of worsened since the CT angiogram of 2019 and follow-up of this finding is recommended using traditional vascular imaging techniques. Limited intracranial: Normal Visualized orbits: Normal Mastoids and visualized paranasal sinuses: Normal Skeleton: Chronic spondylosis and calcified disc herniation at C5-6. The appearance is worsened since 2019 and there could be clinically significant spinal canal stenosis. Patient would be at some risk of compressive myelopathy and dedicated cervical imaging is recommended. Upper chest: Normal Other: None IMPRESSION: 1. In the region of clinical concern, there is a borderline enlarged slightly heterogeneous level 2 lymph node measuring 1.6 x 1.2 x 1.0 cm. Immediately adjacent to that, there is a round 6 x 7 mm level 2 level 3 junction node. Just inferior to that there is a nearly round level 3 node measuring 5 x 5 x 6 mm. Especially because these are at the level of the patient's reported symptoms, they are viewed with some concern. The could be enlarged due to reactive inflammation or could be evidence of early metastatic disease. At the least, close follow-up would be recommended and if symptoms persist or worsen biopsy could be considered. 2. Slight prominence of the mucosal tissue at the base of the tongue which is most commonly benign lingual tonsillar tissue. Direct inspection could be considered to exclude an early tongue base mucosal mass. 3. Previous endarterectomy at the carotid bifurcation on  the left. Extensive calcified plaque at the carotid bifurcation and ICA bulb on the right. This appears to have worsened since the CT angiogram of 2019 and follow-up of this finding is recommended using traditional vascular imaging techniques. 4. Chronic spondylosis and calcified disc herniation at C5-6. The appearance is worsened since 2019 and there could be clinically significant spinal canal stenosis. Patient would be at risk of compressive myelopathy and dedicated cervical imaging is recommended. Electronically Signed   By: Oneil Officer M.D.   On: 01/02/2024 14:42     Assessment/Plan 1. Bilateral carotid  artery stenosis (Primary) Recommend:   Given the patient's asymptomatic subcritical stenosis no further invasive testing or surgery at this time.   Duplex ultrasound shows 40-59% right and widley patent left internal carotid .   Continue antiplatelet therapy as prescribed Continue management of CAD, HTN and Hyperlipidemia Healthy heart diet,  encouraged exercise at least 4 times per week Follow up in 12 months with duplex ultrasound and physical exam. - VAS US  CAROTID; Future  2. Accelerated hypertension Recommend:  Given patient's arterial disease optimal control of the patient's hypertension is important to prevent long term sequela such as CAD and CVA.  Her blood pressure has been somewhat erratic with significant hypertensive spikes associated with syncope.  The patient's vital signs suggest a significant possibility for renal artery stenosis.  Noninvasive studies are indicated at this time and a renal artery duplex will be ordered.  The patient will follow-up here in the office with the duplex ultrasound.  The patient will continue the current antihypertensive medications, no changes at this time.  The primary medical service will continue aggressive antihypertensive therapy as per the AHA guidelines  - VAS US  RENAL ARTERY DUPLEX; Future  3. Mixed hyperlipidemia Continue  statin as ordered and reviewed, no changes at this time  4. Cervical lymphadenopathy CT scan is reviewed with the patient this appears to be reactive lymph nodes and is otherwise benign.  We did discuss option lysing her dental care as this is a likely cause.  There does not appear to be any association to the carotid endarterectomy which was performed 6 years ago.    Cordella Shawl, MD  01/16/2024 11:38 AM

## 2024-01-17 ENCOUNTER — Other Ambulatory Visit (INDEPENDENT_AMBULATORY_CARE_PROVIDER_SITE_OTHER): Payer: Self-pay | Admitting: Vascular Surgery

## 2024-01-17 DIAGNOSIS — I6523 Occlusion and stenosis of bilateral carotid arteries: Secondary | ICD-10-CM

## 2024-01-20 ENCOUNTER — Ambulatory Visit (INDEPENDENT_AMBULATORY_CARE_PROVIDER_SITE_OTHER): Payer: Medicare HMO | Admitting: Vascular Surgery

## 2024-01-20 ENCOUNTER — Ambulatory Visit (INDEPENDENT_AMBULATORY_CARE_PROVIDER_SITE_OTHER): Payer: Medicare HMO

## 2024-01-20 ENCOUNTER — Encounter (INDEPENDENT_AMBULATORY_CARE_PROVIDER_SITE_OTHER): Payer: Self-pay | Admitting: Vascular Surgery

## 2024-01-20 VITALS — BP 148/77 | HR 87 | Ht 60.0 in | Wt 141.0 lb

## 2024-01-20 DIAGNOSIS — E782 Mixed hyperlipidemia: Secondary | ICD-10-CM

## 2024-01-20 DIAGNOSIS — I10 Essential (primary) hypertension: Secondary | ICD-10-CM | POA: Diagnosis not present

## 2024-01-20 DIAGNOSIS — I6523 Occlusion and stenosis of bilateral carotid arteries: Secondary | ICD-10-CM | POA: Diagnosis not present

## 2024-01-20 DIAGNOSIS — R590 Localized enlarged lymph nodes: Secondary | ICD-10-CM | POA: Diagnosis not present

## 2024-01-22 ENCOUNTER — Encounter (INDEPENDENT_AMBULATORY_CARE_PROVIDER_SITE_OTHER): Payer: Self-pay | Admitting: Vascular Surgery

## 2024-01-22 DIAGNOSIS — R590 Localized enlarged lymph nodes: Secondary | ICD-10-CM | POA: Insufficient documentation

## 2024-01-22 DIAGNOSIS — I10 Essential (primary) hypertension: Secondary | ICD-10-CM | POA: Insufficient documentation

## 2024-02-24 ENCOUNTER — Ambulatory Visit (INDEPENDENT_AMBULATORY_CARE_PROVIDER_SITE_OTHER)

## 2024-02-24 ENCOUNTER — Encounter (INDEPENDENT_AMBULATORY_CARE_PROVIDER_SITE_OTHER): Payer: Self-pay | Admitting: Vascular Surgery

## 2024-02-24 ENCOUNTER — Ambulatory Visit (INDEPENDENT_AMBULATORY_CARE_PROVIDER_SITE_OTHER): Admitting: Vascular Surgery

## 2024-02-24 VITALS — BP 131/78 | HR 71 | Resp 16 | Ht 60.0 in | Wt 140.0 lb

## 2024-02-24 DIAGNOSIS — E782 Mixed hyperlipidemia: Secondary | ICD-10-CM

## 2024-02-24 DIAGNOSIS — I6523 Occlusion and stenosis of bilateral carotid arteries: Secondary | ICD-10-CM

## 2024-02-24 DIAGNOSIS — I10 Essential (primary) hypertension: Secondary | ICD-10-CM

## 2024-02-24 NOTE — Progress Notes (Signed)
 MRN : [id number]  Jennifer Oconnor is a 63 y.o. (1960-11-14) female who presents with chief complaint of check circulation.  History of Present Illness:   The patient returns to the office for followup and review of the noninvasive studies regarding hypertension and possible renal artery stenosis. There have been no interval changes in the patient's blood pressure control.  The patient denies any major changes in their medications.  The patient denies headache or flushing.  No flank or unusual back pain.    There have been no significant changes to the patient's overall health care.  No recent shortening of the patient's walking distance or new symptoms consistent with claudication.  No history of rest pain symptoms. No new ulcers or wounds of the lower extremities have occurred.  The patient denies amaurosis fugax or recent TIA symptoms. There are no recent neurological changes noted. There is no history of DVT, PE or superficial thrombophlebitis. No recent episodes of angina or shortness of breath documented.   Duplex ultrasound of the bilateral renal arteries shows widely patent renal arteries with normal flow pattern.  Normal resistive index.  Kidneys are normal size.  Current Meds  Medication Sig   acetaminophen  (TYLENOL ) 325 MG tablet Take 1-2 tablets (325-650 mg total) by mouth every 4 (four) hours as needed for mild pain (or temp >/= 101 F).   ALPRAZolam  (XANAX ) 0.5 MG tablet Take 0.5 mg by mouth 2 (two) times daily as needed for anxiety.   amLODipine (NORVASC) 2.5 MG tablet Take 2.5 mg by mouth daily.   aspirin  EC 81 MG EC tablet Take 1 tablet (81 mg total) by mouth daily.   atorvastatin  (LIPITOR) 80 MG tablet Take 1 tablet (80 mg total) by mouth daily at 6 PM.   buPROPion  (WELLBUTRIN  XL) 150 MG 24 hr tablet    fluorouracil  (EFUDEX ) 5 % cream Apply topically 2 (two) times daily. Apply to the forehead and  temples BID x 7 days.   isosorbide  mononitrate (IMDUR ) 30 MG 24 hr tablet Take 0.5 tablets (15 mg total) by mouth daily.   latanoprost (XALATAN) 0.005 % ophthalmic solution 1 drop at bedtime.   nitroGLYCERIN  (NITROSTAT ) 0.4 MG SL tablet Place 0.4 mg under the tongue every 5 (five) minutes as needed.   sertraline (ZOLOFT) 25 MG tablet Take 25 mg (1 tablet) every morning for one week then increase to 50 mg (two tablets) every morning.    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   Mild    Past Surgical History:  Procedure Laterality Date   ABDOMINAL HYSTERECTOMY  1993   COLON SURGERY     colectomy 9/11   COLONOSCOPY N/A 10/15/2014   Procedure: COLONOSCOPY;  Surgeon: Rogelia Copping, MD;  Location[facility];  Service: Gastroenterology;  Laterality: N/A;  cecum time- 1003   COLONOSCOPY WITH PROPOFOL  N/A 04/14/2021   Procedure: COLONOSCOPY WITH PROPOFOL ;  Surgeon: Jinny Carmine, MD;  Location: [facility];  Service: Endoscopy;  Laterality: N/A;   ENDARTERECTOMY Left 06/14/2017   Procedure: ENDARTERECTOMY CAROTID;  Surgeon: Jama Cordella MATSU, MD;  Location: [facility];  Service: Vascular;  Laterality: Left;   PILONIDAL CYST DRAINAGE  02/13/12   POLYPECTOMY  10/15/2014   Procedure: POLYPECTOMY INTESTINAL;  Surgeon: Carmine Jinny, MD;  Location: [facility];  Service: Gastroenterology;;    Social History Social History   Tobacco Use   Smoking status: Former    Current packs/day: 0.00    Average packs/day: 0.5 packs/day for 40.0 years (20.0 ttl pk-yrs)    Types: Cigarettes    Start date: 06/14/1977    Quit date: 06/14/2017    Years since quitting: 6.7   Smokeless tobacco: Never   Vaping Use   Vaping status: Never Used  Substance Use Topics   Alcohol use: No   Drug use: No    Family History Family History  Problem Relation Age of Onset   Heart attack Mother    Alzheimer's disease Father     Allergies  Allergen Reactions   Lisinopril Cough     REVIEW OF SYSTEMS (Negative unless checked)  Constitutional: [] Weight loss  [] Fever  [] Chills Cardiac: [] Chest pain   [] Chest pressure   [] Palpitations   [] Shortness of breath when laying flat   [] Shortness of breath with exertion. Vascular:  [x] Pain in legs with walking   [] Pain in legs at rest  [] History of DVT   [] Phlebitis   [] Swelling in legs   [] Varicose veins   [] Non-healing ulcers Pulmonary:   [] Uses home oxygen   [] Productive cough   [] Hemoptysis   [] Wheeze  [] COPD   [] Asthma Neurologic:  [] Dizziness   [] Seizures   [] History of stroke   [] History of TIA  [] Aphasia   [] Vissual changes   [] Weakness or numbness in arm   [] Weakness or numbness in leg Musculoskeletal:   [] Joint swelling   [] Joint pain   [] Low back pain Hematologic:  [] Easy bruising  [] Easy bleeding   [] Hypercoagulable state   [] Anemic Gastrointestinal:  [] Diarrhea   [] Vomiting  [] Gastroesophageal reflux/heartburn   [] Difficulty swallowing. Genitourinary:  [] Chronic kidney disease   [] Difficult urination  [] Frequent urination   [] Blood in urine Skin:  [] Rashes   [] Ulcers  Psychological:  [] History of anxiety   []  History of major depression.  Physical Examination  Vitals:   02/24/24 0828  BP: 131/78  Pulse: 71  Resp: 16  Weight: 140 lb (63.5 kg)  Height: 5' (1.524 m)   Body mass index is 27.34 kg/m. Gen: WD/WN, NAD Head: [place]/AT, No temporalis wasting.  Ear/Nose/Throat: Hearing grossly intact, nares w/o erythema or drainage Eyes: PER, EOMI, sclera nonicteric.  Neck: Supple, no masses.  No bruit or JVD.  Pulmonary:  Good air movement, no audible wheezing, no use of accessory muscles.  Cardiac: RRR, normal S1, S2, no  Murmurs. Vascular:   Vessel Right Left  Radial Palpable Palpable  Okay to put together I think gastrointestinal: soft, non-distended. No guarding/no peritoneal signs.  Musculoskeletal: M/S 5/5 throughout.  No visible deformity.  Neurologic: CN 2-12 intact. Pain and light touch intact in extremities.  Symmetrical.  Speech is fluent. Motor exam as listed above. Psychiatric: Judgment intact, Mood & affect appropriate for pt's clinical situation. Dermatologic: No rashes or ulcers noted.  No changes consistent with cellulitis.   CBC Lab Results  Component Value Date   WBC 10.2 06/15/2017   HGB 12.7 06/15/2017   HCT 37.8 06/15/2017   MCV 95.2 06/15/2017   PLT 274 06/15/2017    BMET    Component Value Date/Time   NA 143 01/24/2023 1122   K 4.7 01/24/2023 1122   CL 107 (H) 01/24/2023 1122   CO2 25 01/24/2023 1122   GLUCOSE 82 01/24/2023 1122   GLUCOSE 151 (H) 06/15/2017 0402   BUN 13 01/24/2023 1122   CREATININE 0.83 01/24/2023 1122   CALCIUM  9.3 01/24/2023 1122   GFRNONAA >60 06/15/2017 0402   GFRAA >60 06/15/2017 0402   CrCl cannot be calculated (Patient's most recent lab result is older than the maximum 21 days allowed.).  COAG No results found for: INR, PROTIME  Radiology No results found.   Assessment/Plan 1. Essential hypertension (Primary) Continue antihypertensive medications as already ordered, these medications have been reviewed and there are no changes at this time.  2. Bilateral carotid artery stenosis Recommend:   Given the patient's asymptomatic subcritical stenosis no further invasive testing or surgery at this time.   Duplex ultrasound shows 40-59% right and widley patent left internal carotid .   Continue antiplatelet therapy as prescribed Continue management of CAD, HTN and Hyperlipidemia Healthy heart diet,  encouraged exercise at least 4 times per week Follow up in 12 months with duplex ultrasound and physical exam.  3. Mixed  hyperlipidemia Continue statin as ordered and reviewed, no changes at this time    Cordella Shawl, MD  02/24/2024 8:35 AM

## 2024-05-12 ENCOUNTER — Other Ambulatory Visit: Payer: Self-pay

## 2024-05-13 MED ORDER — ISOSORBIDE MONONITRATE ER 30 MG PO TB24: ORAL_TABLET | ORAL | 0 refills | Status: AC

## 2024-06-24 ENCOUNTER — Encounter: Payer: Self-pay | Admitting: Cardiology

## 2024-06-24 ENCOUNTER — Ambulatory Visit: Payer: Self-pay | Attending: Cardiology | Admitting: Cardiology

## 2024-06-24 VITALS — BP 110/58 | HR 95 | Ht 60.0 in | Wt 144.6 lb

## 2024-06-24 DIAGNOSIS — I10 Essential (primary) hypertension: Secondary | ICD-10-CM | POA: Insufficient documentation

## 2024-06-24 DIAGNOSIS — I6350 Cerebral infarction due to unspecified occlusion or stenosis of unspecified cerebral artery: Secondary | ICD-10-CM | POA: Diagnosis not present

## 2024-06-24 DIAGNOSIS — I25118 Atherosclerotic heart disease of native coronary artery with other forms of angina pectoris: Secondary | ICD-10-CM | POA: Insufficient documentation

## 2024-06-24 DIAGNOSIS — I6523 Occlusion and stenosis of bilateral carotid arteries: Secondary | ICD-10-CM | POA: Diagnosis not present

## 2024-06-24 DIAGNOSIS — E782 Mixed hyperlipidemia: Secondary | ICD-10-CM | POA: Diagnosis not present

## 2024-06-24 NOTE — Progress Notes (Signed)
 " Cardiology Office Note   Date:  06/24/2024  ID:  Jennifer Oconnor, Jennifer Oconnor 1960-08-14, MRN [id number] PCP: Corlis Honor BROCKS, MD (Inactive)  [facility] Providers Cardiologist:  Redell Cave, MD Cardiology APP:  Gerard Frederick, NP     History of Present Illness Jennifer Oconnor is a 64 y.o. female with a past medical history of hyperlipidemia, hypertension, CVA, (2019), carotid stenosis status post left TCAR (2019), former smoker x 30+ years, who presents today for follow-up.   Previous echocardiogram completed in 2019 revealed an LVEF of 60 to 65%, no RWMA, and no significant valvular abnormalities.  Repeat echocardiogram revealed an LVEF of 60 to 65%, no RWMA, G1 DD, and mild to moderate TR, mild to moderate MR. Coronary CTA was ordered and pending.   She was seen in clinic 01/24/2023 by Dr.Agbor-Etang with complaints of chest pain associated with stress or exertion over the last 2 to 3 months.  Symptoms seem to have worsened in severity.  She describes discomfort as a pressure-like located in the central portion of her chest.  She denied any other associated symptoms.  She was scheduled for repeat echocardiogram and a coronary CTA.  She was seen in clinic 03/04/2023 stating previous symptoms of chest pain occasional shortness of breath and actually improved since that she had undergone her repeat testing.  There were no medication changes that were made or further testing that was ordered at the time.  She was last seen in clinic 05/25/2023 stating she had been doing well from a cardiac perspective.  Denies chest pain and feels well without any concerns.  Blood pressure was well-controlled on her current medication regimen.  There were no changes that were made to her medication and no further testing that was ordered.   She returns to clinic today stating that she has been doing well for the cardiac perspective.  She denies any chest pain, shortness of breath or dyspnea exertion.  She  continues to suffer from anxiety and has her service dog with her today.  She states she has been compliant with her current medication regimen with her primary care provider making recent changes to her antianxiety medications.  She denies any recent hospitalizations or visits to the emergency department.  ROS: 10 point review of systems has been reviewed and considered negative the exception was been listed in the HPI  Studies Reviewed EKG Interpretation Date/Time:  Wednesday June 24 2024 14:07:33 EST Ventricular Rate:  95 PR Interval:  146 QRS Duration:  68 QT Interval:  342 QTC Calculation: 429 R Axis:   65  Text Interpretation: Normal sinus rhythm Nonspecific ST and T wave abnormality When compared with ECG of 04-Mar-2023 11:33, No significant change was found Confirmed by Gerard Frederick ([id number]) on 06/24/2024 2:10:23 PM    cCTA FFR 02/28/2023 IMPRESSION: 1.  CT FFR analysis didn't show any significant stenosis.  cCTA 02/28/2023 IMPRESSION: 1. Coronary calcium  score of 275. This was 93rd percentile for age and sex matched control.   2. Normal coronary origin with right dominance.   3. Moderate proximal LAD stenosis (50-69%).   4. CAD-RADS 3. Moderate stenosis. Consider symptom-guided anti-ischemic pharmacotherapy as well as risk factor modification per guideline directed care.   5. Additional analysis with CT FFR will be submitted and reported separately.  TTE 02/20/23 1. Left ventricular ejection fraction, by estimation, is 60 to 65%. The  left ventricle has normal function. The left ventricle has no regional  wall motion abnormalities. Left ventricular diastolic  parameters are  consistent with Grade I diastolic  dysfunction (impaired relaxation). The average left ventricular global  longitudinal strain is -22.0 %.   2. Right ventricular systolic function is normal. The right ventricular  size is normal. There is normal pulmonary artery systolic pressure. The   estimated right ventricular systolic pressure is 26.3 mmHg.   3. The mitral valve is normal in structure. Mild to moderate mitral valve  regurgitation. No evidence of mitral stenosis.   4. Tricuspid valve regurgitation is mild to moderate.   5. The aortic valve is tricuspid. Aortic valve regurgitation is not  visualized. No aortic stenosis is present.   6. The inferior vena cava is normal in size with greater than 50%  respiratory variability, suggesting right atrial pressure of 3 mmHg.      TTE 06/12/2017 Left ventricle: The cavity size was normal. Wall thickness was    normal. Systolic function was normal. The estimated ejection    fraction was in the range of 60% to 65%. Wall motion was normal;    there were no regional wall motion abnormalities. Left    ventricular diastolic function parameters were normal for the    patient&'s age.  - Right ventricle: The cavity size was normal. Wall thickness was    normal. Systolic function was normal.  - No significant valvular abnormality.   Risk Assessment/Calculations           Physical Exam VS:  BP (!) 110/58 (BP Location: Left Arm, Patient Position: Sitting)   Pulse 95   Ht 5' (1.524 m)   Wt 144 lb 9.6 oz (65.6 kg)   SpO2 98%   BMI 28.24 kg/m        Wt Readings from Last 3 Encounters:  06/24/24 144 lb 9.6 oz (65.6 kg)  02/24/24 140 lb (63.5 kg)  01/20/24 141 lb (64 kg)    GEN: Well nourished, well developed in no acute distress NECK: No JVD; No carotid bruits CARDIAC: RRR, no murmurs, rubs, gallops RESPIRATORY:  Clear to auscultation without rales, wheezing or rhonchi  ABDOMEN: Soft, non-tender, non-distended EXTREMITIES:  No edema; No deformity   ASSESSMENT AND PLAN 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  She continues to deny anginal or symptoms of decompensation.  Prior coronary CTA revealed a calcium  score to 75 which is 93rd percentile for age and sex matched control.  Moderate proximal  LAD stenosis (50-69%), CT FFR revealing no significant stenosis noted.  She was continued on aspirin  81 mg daily and atorvastatin  80 mg daily.  EKG today reveals sinus rhythm with a rate of 95 with nonspecific ST and T wave abnormality with no significant change noted from prior study.  No further ischemic evaluation needed at this time.  Hypertension with a blood pressure 110/58.  Blood pressures remain stable.  She is continued on amlodipine 2.5 mg daily and olmesartan 20 mg daily.  She has been encouraged to monitor pressures 1 to 2 hours postmedication administration at home as well.  Hyperlipidemia with her LDL creeping up to 79.  She has upcoming labs with her PCP.  She is continued on atorvastatin  80 mg daily.  Goal is 70 or less.  Carotid artery stenosis status post left CEA in 2019.  Continued on aspirin  and statin therapy and continues to be managed by [facility].  History of CVA with residual deficits and impaired short-term memory.       Dispo: Patient to return to clinic to see MD/APP in 75  to 12 months or sooner if needed for further evaluation.  Signed, Ralphael Southgate, NP   "

## 2024-06-24 NOTE — Patient Instructions (Signed)
 Medication Instructions:  Your physician recommends that you continue on your current medications as directed. Please refer to the Current Medication list given to you today.  *If you need a refill on your cardiac medications before your next appointment, please call your pharmacy*  Lab Work: No labs ordered today  If you have labs (blood work) drawn today and your tests are completely normal, you will receive your results only by: MyChart Message (if you have MyChart) OR A paper copy in the mail If you have any lab test that is abnormal or we need to change your treatment, we will call you to review the results.  Testing/Procedures: No test ordered today   Follow-Up: At [facility], you and your health needs are our priority.  As part of our continuing mission to provide you with exceptional heart care, our providers are all part of one team.  This team includes your primary Cardiologist (physician) and Advanced Practice Providers or APPs (Physician Assistants and Nurse Practitioners) who all work together to provide you with the care you need, when you need it.  Your next appointment:   12 month(s)  Provider:   You may see Redell Cave, MD or one of the following Advanced Practice Providers on your designated Care Team:    Tylene Lunch, NP   We recommend signing up for the patient portal called MyChart.  Sign up information is provided on this After Visit Summary.  MyChart is used to connect with patients for Virtual Visits (Telemedicine).  Patients are able to view lab/test results, encounter notes, upcoming appointments, etc.  Non-urgent messages can be sent to your provider as well.   To learn more about what you can do with MyChart, go to [email or web address].

## 2024-08-12 ENCOUNTER — Ambulatory Visit: Admitting: Dermatology

## 2025-01-18 ENCOUNTER — Ambulatory Visit (INDEPENDENT_AMBULATORY_CARE_PROVIDER_SITE_OTHER): Admitting: Vascular Surgery

## 2025-01-18 ENCOUNTER — Encounter (INDEPENDENT_AMBULATORY_CARE_PROVIDER_SITE_OTHER)
